# Patient Record
Sex: Female | Born: 1965
Health system: Southern US, Community
[De-identification: ages and names within clinical notes are randomized; demographics above are authoritative.]

## PROBLEM LIST (undated history)

## (undated) DIAGNOSIS — E039 Hypothyroidism, unspecified: Secondary | ICD-10-CM

## (undated) DIAGNOSIS — E079 Disorder of thyroid, unspecified: Secondary | ICD-10-CM

## (undated) DIAGNOSIS — I10 Essential (primary) hypertension: Secondary | ICD-10-CM

## (undated) DIAGNOSIS — G4730 Sleep apnea, unspecified: Secondary | ICD-10-CM

## (undated) DIAGNOSIS — E785 Hyperlipidemia, unspecified: Secondary | ICD-10-CM

## (undated) DIAGNOSIS — M109 Gout, unspecified: Secondary | ICD-10-CM

## (undated) DIAGNOSIS — M1990 Unspecified osteoarthritis, unspecified site: Secondary | ICD-10-CM

## (undated) DIAGNOSIS — J45909 Unspecified asthma, uncomplicated: Secondary | ICD-10-CM

## (undated) DIAGNOSIS — K219 Gastro-esophageal reflux disease without esophagitis: Secondary | ICD-10-CM

## (undated) HISTORY — PX: THYROID SURGERY: SHX805

## (undated) HISTORY — DX: Essential (primary) hypertension: I10

## (undated) HISTORY — DX: Disorder of thyroid, unspecified: E07.9

## (undated) HISTORY — DX: Unspecified asthma, uncomplicated: J45.909

## (undated) HISTORY — DX: Unspecified osteoarthritis, unspecified site: M19.90

## (undated) HISTORY — PX: HAND SURGERY: SHX662

## (undated) HISTORY — DX: Morbid (severe) obesity due to excess calories: E66.01

## (undated) HISTORY — DX: Hyperlipidemia, unspecified: E78.5

---

## 1983-11-18 HISTORY — PX: CHOLECYSTECTOMY: SHX55

## 2008-07-02 ENCOUNTER — Encounter: Admission: RE | Admit: 2008-07-02 | Discharge: 2008-07-02 | Payer: Self-pay | Admitting: Orthopedic Surgery

## 2009-05-16 ENCOUNTER — Ambulatory Visit (HOSPITAL_COMMUNITY): Admission: RE | Admit: 2009-05-16 | Discharge: 2009-05-16 | Payer: Self-pay | Admitting: Orthopedic Surgery

## 2009-06-27 ENCOUNTER — Encounter: Admission: RE | Admit: 2009-06-27 | Discharge: 2009-08-16 | Payer: Self-pay | Admitting: Orthopedic Surgery

## 2009-11-17 HISTORY — PX: OTHER SURGICAL HISTORY: SHX169

## 2010-02-18 ENCOUNTER — Encounter: Admission: RE | Admit: 2010-02-18 | Discharge: 2010-02-18 | Payer: Self-pay | Admitting: Unknown Physician Specialty

## 2010-03-12 ENCOUNTER — Encounter
Admission: RE | Admit: 2010-03-12 | Discharge: 2010-03-12 | Payer: Self-pay | Admitting: Physical Medicine & Rehabilitation

## 2010-05-13 ENCOUNTER — Encounter: Admission: RE | Admit: 2010-05-13 | Discharge: 2010-05-13 | Payer: Self-pay | Admitting: Unknown Physician Specialty

## 2010-05-25 ENCOUNTER — Emergency Department (HOSPITAL_COMMUNITY): Admission: EM | Admit: 2010-05-25 | Discharge: 2010-05-25 | Payer: Self-pay | Admitting: Emergency Medicine

## 2010-07-04 ENCOUNTER — Encounter: Admission: RE | Admit: 2010-07-04 | Discharge: 2010-07-04 | Payer: Self-pay | Admitting: Unknown Physician Specialty

## 2010-10-01 ENCOUNTER — Other Ambulatory Visit: Admission: RE | Admit: 2010-10-01 | Discharge: 2010-10-01 | Payer: Self-pay | Admitting: Registered Nurse

## 2010-11-17 HISTORY — PX: HAND SURGERY: SHX662

## 2010-12-08 ENCOUNTER — Encounter: Payer: Self-pay | Admitting: Unknown Physician Specialty

## 2011-02-24 LAB — CBC
MCHC: 33.5 g/dL (ref 30.0–36.0)
RBC: 4.61 MIL/uL (ref 3.87–5.11)
WBC: 6.5 10*3/uL (ref 4.0–10.5)

## 2011-02-24 LAB — COMPREHENSIVE METABOLIC PANEL
ALT: 12 U/L (ref 0–35)
Albumin: 3.7 g/dL (ref 3.5–5.2)
Alkaline Phosphatase: 99 U/L (ref 39–117)
Calcium: 10.2 mg/dL (ref 8.4–10.5)
Chloride: 107 mEq/L (ref 96–112)
GFR calc Af Amer: >60 mL/min (ref >60)
Total Bilirubin: 0.6 mg/dL (ref 0.3–1.2)
Total Protein: 7.2 g/dL (ref 6.0–8.3)

## 2011-02-24 LAB — DIFFERENTIAL
Lymphocytes Relative: 28 % (ref 12–46)
Lymphs Abs: 1.8 10*3/uL (ref 0.7–4.0)
Neutro Abs: 3.7 10*3/uL (ref 1.7–7.7)
Neutrophils Relative %: 56 % (ref 43–77)

## 2011-03-25 ENCOUNTER — Observation Stay (HOSPITAL_COMMUNITY)
Admission: EM | Admit: 2011-03-25 | Discharge: 2011-03-26 | Disposition: A | Discharge status: Home / Self Care | Payer: PRIVATE HEALTH INSURANCE | Source: Ambulatory Visit | Attending: Hospitalist | Admitting: Hospitalist

## 2011-03-25 ENCOUNTER — Emergency Department (HOSPITAL_COMMUNITY): Payer: PRIVATE HEALTH INSURANCE

## 2011-03-25 DIAGNOSIS — I2510 Atherosclerotic heart disease of native coronary artery without angina pectoris: Secondary | ICD-10-CM | POA: Insufficient documentation

## 2011-03-25 DIAGNOSIS — R0989 Other specified symptoms and signs involving the circulatory and respiratory systems: Secondary | ICD-10-CM | POA: Insufficient documentation

## 2011-03-25 DIAGNOSIS — E785 Hyperlipidemia, unspecified: Secondary | ICD-10-CM | POA: Insufficient documentation

## 2011-03-25 DIAGNOSIS — Z79899 Other long term (current) drug therapy: Secondary | ICD-10-CM | POA: Insufficient documentation

## 2011-03-25 DIAGNOSIS — R0609 Other forms of dyspnea: Secondary | ICD-10-CM | POA: Insufficient documentation

## 2011-03-25 DIAGNOSIS — K219 Gastro-esophageal reflux disease without esophagitis: Secondary | ICD-10-CM | POA: Insufficient documentation

## 2011-03-25 DIAGNOSIS — E039 Hypothyroidism, unspecified: Secondary | ICD-10-CM | POA: Insufficient documentation

## 2011-03-25 DIAGNOSIS — R0789 Other chest pain: Principal | ICD-10-CM | POA: Insufficient documentation

## 2011-03-25 LAB — DIFFERENTIAL
Basophils Relative: 0 % (ref 0–1)
Eosinophils Absolute: 0.2 10*3/uL (ref 0.0–0.7)
Lymphocytes Relative: 32 % (ref 12–46)
Monocytes Absolute: 1.3 10*3/uL — ABNORMAL HIGH (ref 0.1–1.0)
Monocytes Relative: 13 % — ABNORMAL HIGH (ref 3–12)
Neutrophils Relative %: 53 % (ref 43–77)

## 2011-03-25 LAB — COMPREHENSIVE METABOLIC PANEL
Alkaline Phosphatase: 109 U/L (ref 39–117)
CO2: 25 mEq/L (ref 19–32)
Chloride: 104 mEq/L (ref 96–112)
Sodium: 138 mEq/L (ref 135–145)
Total Bilirubin: 0.4 mg/dL (ref 0.3–1.2)

## 2011-03-25 LAB — POCT CARDIAC MARKERS
CKMB, poc: <1 ng/mL — ABNORMAL LOW (ref 1.0–8.0)
CKMB, poc: <1 ng/mL — ABNORMAL LOW (ref 1.0–8.0)
Myoglobin, poc: 43.8 ng/mL (ref 12–200)
Troponin i, poc: <0.05 ng/mL (ref 0.00–0.09)
Troponin i, poc: <0.05 ng/mL (ref 0.00–0.09)

## 2011-03-25 LAB — D-DIMER, QUANTITATIVE: D-Dimer, Quant: 0.29 ug/mL-FEU (ref 0.00–0.48)

## 2011-03-25 LAB — CBC: HCT: 40.7 % (ref 36.0–46.0)

## 2011-03-26 LAB — CBC
MCHC: 33.9 g/dL (ref 30.0–36.0)
MCV: 92.7 fL (ref 78.0–100.0)
Platelets: 347 10*3/uL (ref 150–400)
RBC: 4.39 MIL/uL (ref 3.87–5.11)

## 2011-03-26 LAB — LIPID PANEL
Cholesterol: 158 mg/dL (ref 0–200)
LDL Cholesterol: 84 mg/dL (ref 0–99)
Total CHOL/HDL Ratio: 4.9 RATIO
VLDL: 42 mg/dL — ABNORMAL HIGH (ref 0–40)

## 2011-03-26 LAB — CARDIAC PANEL(CRET KIN+CKTOT+MB+TROPI)
CK, MB: 1.1 ng/mL (ref 0.3–4.0)
Relative Index: 1.1 (ref 0.0–2.5)
Troponin I: <0.3 ng/mL (ref <0.30)

## 2011-03-26 LAB — COMPREHENSIVE METABOLIC PANEL
Albumin: 3.4 g/dL — ABNORMAL LOW (ref 3.5–5.2)
Alkaline Phosphatase: 111 U/L (ref 39–117)
Chloride: 102 mEq/L (ref 96–112)
GFR calc Af Amer: >60 mL/min (ref >60)
GFR calc non Af Amer: >60 mL/min (ref >60)
Potassium: 3.8 mEq/L (ref 3.5–5.1)
Total Bilirubin: 0.6 mg/dL (ref 0.3–1.2)

## 2011-03-26 LAB — TSH: TSH: 0.586 u[IU]/mL (ref 0.350–4.500)

## 2011-03-30 NOTE — Discharge Summary (Signed)
  [name], [name]                ACCOUNT NO.:  [id number]  MEDICAL RECORD NO.:  [id number]           PATIENT TYPE:  O  LOCATION:  3728                         FACILITY:  [facility]  PHYSICIAN:  Jeoffrey Massed, MD    DATE OF BIRTH:  08-21-1966  DATE OF ADMISSION:  03/25/2011 DATE OF DISCHARGE:                        DISCHARGE SUMMARY - REFERRING   PRIMARY CARE PRACTITIONER:  Georgianne Fick, MD.  PRIMARY CARDIOLOGIST:  Jay R. Jacinto Halim, MD  PRIMARY DISCHARGE DIAGNOSIS:  Atypical chest pain likely gastrointestinal in nature.  SECONDARY DISCHARGE DIAGNOSES: 1. Hypertension. 2. Dyslipidemia. 3. Hypothyroidism. 4. Prior history of gastroesophageal reflux disease. 5. Hypothyroidism. 6. Morbid obesity.  CONSULTATION:  Dr. Jacinto Halim, from Cardiology.  BRIEF HISTORY OF PRESENT ILLNESS:  The patient is a 45 year old obese African-American female with the above-noted medical issues, presented to the ED with chest pain.  She described the chest pain as heaviness, but also claimed that she felt as if the food was getting stuck in throat.  She had like 2 to 3 episodes of this on the day of admission and has had no further episodes here in the hospital.  For further details, please see the history and physical that was dictated by Dr. Toniann Fail on admission.  PERTINENT LABORATORY DATA: 1. Cardiac enzymes were cycled and these were negative. 2. LDL cholesterol was 84. 3. TSH was 0.586. 4. D-dimer was 0.29.  PERTINENT RADIOLOGICAL STUDIES:  Chest x-ray showed cardiomegaly and bibasilar atelectasis.  BRIEF HOSPITAL COURSE: 1. Chest pain.  This was felt to be more GI in nature.  Given her risk     factors and as the patient sees Dr. Jacinto Halim as an outpatient, the     patient was seen in consult with Dr. Jacinto Halim while the patient was in     the hospital.  He suggested no further workup be done in the     patient and the patient see a gastroenterologist as an outpatient.     Rest of her medical  issues were stable. 2. Disposition:  It is felt that this patient is stable to be     discharged home.  FOLLOWUP INSTRUCTIONS: 1. The patient to follow up with the primary care practitioner within     1 week upon discharge. 2. The patient will need referral to see a gastroenterologist from her     primary care practitioner.  We will leave this to the discretion of     her primary care practitioner. 3. The patient to follow up with Dr. Jacinto Halim within 1 or 2 weeks upon     discharge.  Total time spent coordinating discharge equals 45 minutes.     Jeoffrey Massed, MD     SG/MEDQ  D:  03/26/2011  T:  03/26/2011  Job:  [id number]  cc:   Cristy Hilts. Jacinto Halim, MD Georgianne Fick, M.D.  Electronically Signed by Jeoffrey Massed  on 03/30/2011 03:00:24 PM

## 2011-03-30 NOTE — H&P (Signed)
[name], [name]                ACCOUNT NO.:  [id number]  MEDICAL RECORD NO.:  [id number]           PATIENT TYPE:  O  LOCATION:  3728                         FACILITY:  [facility]  PHYSICIAN:  Eduard Clos, MDDATE OF BIRTH:  February 19, 1966  DATE OF ADMISSION:  03/25/2011 DATE OF DISCHARGE:                             HISTORY & PHYSICAL   PRIMARY CARE PHYSICIAN:  Georgianne Fick, MD  CHIEF COMPLAINT:  Chest pain.  HISTORY OF PRESENTING ILLNESS:  A 45 year old female with known history of hypertension, hyperlipidemia, obesity, hypothyroidism, has presented to the ER with a complaint of chest pain.  She states the chest pain started last afternoon around 11 o'clock while she was watching TV.  The pain was retrosternal, associated with no shortness of breath, no nausea, vomiting or diaphoresis, or palpitations, or any abdominal pain. In the ER, the patient had cardiac enzymes and EKG which at this time does not show anything acute.  The patient is admitted for further workup.  The patient states the chest pain is retrosternal, pressure-like, lasts for less than 2 minutes each time, has come multiple times.  The patient denies any headache or visual symptoms.  Denies any focal deficit. Denies any dizziness, loss of consciousness.  Denies any nausea, vomiting, abdominal pain, dysuria, discharge, diarrhea.  PAST MEDICAL HISTORY: 1. History of hypertension. 2. Hyperlipidemia. 3. Hypothyroidism. 4. History of GERD.  Has had a cardiac cath in [place] 4-5 years ago and the patient states that one of the arteries was not able to be stented, they found it difficult.  PAST SURGICAL HISTORY:  Gallbladder surgery, has had a tubal pregnancy, __________ surgery.  MEDICATIONS PRIOR TO ADMISSION:  Atenolol, Crestor, levothyroxine, nitroglycerin, Norvasc, and Prilosec.  Medication doses have to be verified.  ALLERGIES:  IBUPROFEN and LYRICA.  FAMILY HISTORY:  Significant for  coronary artery disease.  SOCIAL HISTORY:  The patient does not smoke cigarettes, drink alcohol, or use illegal drugs.  REVIEW OF SYSTEMS:  As per history of present illness, nothing else significant.  PHYSICAL EXAMINATION:  GENERAL:  The patient examined bedside, not in acute distress. VITAL SIGNS:  Blood pressure 120/70, pulse 50 per minute, temperature 98.0, respirations 18, O2 sat 96%. HEENT: Anicteric.  No pallor.  No discharge from ears, eyes, nose, or mouth. CHEST:  Bilateral air entry present.  No rhonchi.  No crepitation. HEART: S1, S2 heard. ABDOMEN:  Soft, nontender.  Bowel sounds heard. CNS:  The patient is alert, awake, oriented to time, place, and person. Moves upper and lower extremities 5/5. EXTREMITIES:  Peripheral pulses felt.  No edema.  LABORATORY DATA:  EKG shows normal sinus rhythm, heart rate is around 54 beats per minute with nonspecific ST-T changes.  There is some T-wave inversion in the V3 and inferior leads.  The lateral leads showing low voltage at this time.  Chest x-ray shows cardiomegaly and basilar atelectasis.  CBC:  WBC is 10.2, hemoglobin is 13. 5, hematocrit 40.7, platelets 356.  D-dimers 0.29.  Complete metabolic panel:  Sodium 138, potassium 3.7, chloride 104, carbon dioxide 95, glucose 98, BUN 9, creatinine 0.7, total bilirubin is  0.4, alkaline phosphatase 109, AST 13, ALT 10, total protein 7.5, albumin 3.6, calcium 9.9, CK-MB is less than 1, troponin less than 0.05, myoglobin is 39.7.  ASSESSMENT: 1. Chest pain, rule out acute coronary syndrome. 2. History of hypertension. 3. History of hyperlipidemia. 4. History of hyperthyroidism. 5. History of obesity.  PLAN: 1. At this time, we will admit the patient to telemetry. 2. Chest pain.  At this time the patient is chest-pain free.  We will     cycle cardiac markers.  The patient was given aspirin.  We will get     a 2-D echo, nitroglycerin. 3. For her hypertension and hypothyroidism,  we will continue present     medication.  We will check her TSH and a fasting lipid panel and     further recommendation based on tests ordered and clinical course.     Eduard Clos, MD     ANK/MEDQ  D:  03/26/2011  T:  03/26/2011  Job:  [id number]  cc:   Georgianne Fick, M.D.  Electronically Signed by Midge Minium MD on 03/30/2011 08:39:31 AM

## 2011-04-01 NOTE — Op Note (Signed)
NAMEDEEANNA, BEIGHTOL                ACCOUNT NO.:  [id number]   MEDICAL RECORD NO.:  [id number]          PATIENT TYPE:  AMB   LOCATION:  SDS                          FACILITY:  [facility]   PHYSICIAN:  Nadara Mustard, MD     DATE OF BIRTH:  13-Jul-1966   DATE OF PROCEDURE:  05/16/2009  DATE OF DISCHARGE:  05/16/2009                               OPERATIVE REPORT   PREOPERATIVE DIAGNOSIS:  Rotator cuff tear, right shoulder with  impingement syndrome.   POSTOPERATIVE DIAGNOSIS:  Rotator cuff tear, right shoulder with  impingement syndrome.   PROCEDURE:  1. Right shoulder arthroscopy, debridement of rotator cuff tear,      debridement of superior labrum anterior and posterior lesion, and      subacromial decompression.  2. Extra time was used for surgery secondary to body mass index of 55.   SURGEON:  Nadara Mustard, MD   ANESTHESIA:  General plus interscalene block.   ESTIMATED BLOOD LOSS:  Minimal.   ANTIBIOTICS:  None.   DRAINS:  None.   COMPLICATIONS:  None.   DISPOSITION:  To PACU in stable condition.   INDICATIONS FOR PROCEDURE:  The patient is a 45 year old woman with  impingement symptom and rotator cuff tear of the right shoulder.  She  has failed conservative care, has pain with activities of daily living,  and presents at this time for arthroscopic intervention.  Risks and  benefits were discussed including infection, neurovascular injury,  persistent pain, and need for additional surgery.  The patient states  she understands and wished proceed at this time.   DESCRIPTION OF PROCEDURE:  The patient was brought to OR 15 after  undergoing an interscalene block.  She then underwent general  anesthetic.  After adequate level of anesthesia was obtained, the  patient was placed in a beach-chair position.  The right upper extremity  was prepped using DuraPrep and draped into a sterile field.  The scope  was inserted from the posterior portal and the anterior portal was  established from an outside-in technique with an 18-gauge spinal.  Visualization showed grade 1 SLAP lesion and partial tearing of the  biceps with a rotator cuff tear.  The patient underwent debridement of  the SLAP lesion, debridement of biceps tendon tear as well as  debridement of rotator cuff tear.  The shaver and the VAPR wand were  used.  The instruments were then removed.  The scope was then inserted  from the posterior portal in the subacromial space and a new lateral  portal was established.  The patient had bursitis and the bursa was  excised.  She had a hook-type 3 acromion.  The patient underwent a  subacromial decompression.  The VAPR wand was used for hemostasis.  The  instruments were  removed.  The portals were closed using 2-0 nylon.  The wounds were  covered with Adaptic, orthopedic sponges, ABD dressing, and Hypafix  tape.  The patient was placed in a sling, extubated, taken to PACU in  stable condition.  Plan for discharge to home.  Prescription for Tylox  for  pain.  Plan to follow up in the office in 2 weeks.      Nadara Mustard, MD  Electronically Signed     MVD/MEDQ  D:  05/16/2009  T:  05/16/2009  Job:  [id number]

## 2011-04-08 ENCOUNTER — Other Ambulatory Visit: Payer: Self-pay | Admitting: *Deleted

## 2011-04-08 ENCOUNTER — Ambulatory Visit
Admission: RE | Admit: 2011-04-08 | Discharge: 2011-04-08 | Disposition: A | Discharge status: Home / Self Care | Payer: PRIVATE HEALTH INSURANCE | Source: Ambulatory Visit | Attending: *Deleted | Admitting: *Deleted

## 2011-04-08 DIAGNOSIS — M545 Low back pain, unspecified: Secondary | ICD-10-CM

## 2011-04-08 NOTE — Consult Note (Signed)
NAMELUNDEN, STIEBER                ACCOUNT NO.:  [id number]  MEDICAL RECORD NO.:  [id number]           PATIENT TYPE:  O  LOCATION:  3728                         FACILITY:  [facility]  PHYSICIAN:  Cristy Hilts. Jacinto Halim, MD       DATE OF BIRTH:  [birth year]  DATE OF CONSULTATION:  03/26/2011 DATE OF DISCHARGE:  03/26/2011                                CONSULTATION   REASON FOR CONSULTATION:  Chest pain, please evaluate angina pectoris.  HISTORY:  [name] is a patient of mine who has had known coronary artery disease by cardiac catheterization in 2006 when she presented with chest pain and was felt to be not amenable for percutaneous revascularization and was treated medically.  She had been doing well until yesterday.  She developed chest heaviness.  She described this as pressure-like sensation in the middle of her chest lasting a few seconds.  It kept on and off all afternoon, hence she presented to the emergency room.  She states that she feels like the food is struck in the middle of her chest.  She does not feel like this is angina pectoris.  She has taken sublingual nitroglycerin without any relief.  REVIEW OF SYSTEMS:  She has arthritis of left knee worse than the right. She is not a diabetic.  She has hypothyroid and takes replacements.  No prior history of stroke or GI bleed.  No recent weight change.  Other systems are negative.  MEDICATIONS:  Her home medications include; 1. Omeprazole 40 mg p.o. daily. 2. Aspirin 325 mg p.o. daily. 3. Voltaren gel p.r.n. 4. Amlodipine 5 mg p.o. daily. 5. Synthroid 175 mcg p.o. daily. 6. Crestor 40 mg p.o. daily. 7. Atenolol 50 mg p.o. daily.  ALLERGIES:  No known drug allergies.  PAST MEDICAL HISTORY:  Significant for cholecystectomy in 1989, partial thyroidectomy in 2008, cardiac catheterization in 2006.  ALLERGIES:  IBUPROFEN, she has difficulty in urination and LYRICA causes her to have cramping and nausea.  Her medical  problems include hypertension, hyperlipidemia, hypothyroidism, GERD, and chronic chest pain.  SOCIAL HISTORY:  She does not use any tobacco products.  She does not drink alcohol.  Her 3 children are married.  She is presently disabled from work.  PHYSICAL EXAMINATION:  GENERAL:  She is moderately built, morbidly obese.  She appears to be in stated age.  She is not in any acute distress. VITAL SIGNS:  Temperature of 97.3, pulse is 76 beats per minute, respirations 14, blood pressure 98/68 mmHg. CARDIAC:  S1 and S2 is normal.  There is distant heart sounds.  No gallop or murmur. CHEST:  Clear. ABDOMEN:  Obese abdomen with pannus. EXTREMITIES:  Warm, nontender without any edema.  Peripheral arterial examination was normal.  Her EKG demonstrated sinus rhythm, poor R-wave progression, nonspecific T-wave changes.  Her cardiac markers have been negative for myocardial injury x2.  Her CBC and BMP within normal limits.  IMPRESSION: 1. Chest pain appears to be more of gastroesophageal reflux disease     and esophageal spasm, then to angina pectoris. 2. History of known coronary artery disease by  cardiac catheterization     in 2006, felt not amenable for angioplasty, was treated medically. 3. Morbid obesity. 4. Hypertension. 5. Hyperlipidemia.  RECOMMENDATIONS:  At this point I do not feel the chest pain is from cardiac etiology.  Hence, the patient can be discharged home safely. She has had a Cardiolite stress test in our office recently on November 15, 2010, which revealed apical scar but no evidence of ischemia, low risk.  The defect probably represented gut uptake artifact and breast attenuation artifact in a patient who is morbidly obese with a BMI of 57.  I will see her back in the office in about 2 weeks.  I recommend that she probably try Dexilant instead of omeprazole for her GERD.  She may need further GI workup in the outpatient basis.  Thank you for asking me to see the  patient.     Cristy Hilts. Jacinto Halim, MD     JRG/MEDQ  D:  03/26/2011  T:  03/26/2011  Job:  [id number]  cc:   Georgianne Fick, M.D.  Electronically Signed by Yates Decamp MD on 04/08/2011 07:50:45 PM

## 2011-04-17 ENCOUNTER — Other Ambulatory Visit: Payer: Self-pay | Admitting: Pain Medicine

## 2011-04-17 DIAGNOSIS — M545 Low back pain: Secondary | ICD-10-CM

## 2011-06-23 ENCOUNTER — Other Ambulatory Visit (HOSPITAL_COMMUNITY): Payer: PRIVATE HEALTH INSURANCE

## 2011-06-23 ENCOUNTER — Encounter (HOSPITAL_COMMUNITY)
Admission: RE | Admit: 2011-06-23 | Discharge: 2011-06-23 | Disposition: A | Discharge status: Home / Self Care | Payer: PRIVATE HEALTH INSURANCE | Source: Ambulatory Visit | Attending: Oral Surgery | Admitting: Oral Surgery

## 2011-06-23 LAB — CBC
HCT: 39.9 % (ref 36.0–46.0)
Hemoglobin: 13.7 g/dL (ref 12.0–15.0)
MCHC: 34.3 g/dL (ref 30.0–36.0)
RBC: 4.3 MIL/uL (ref 3.87–5.11)
WBC: 9 10*3/uL (ref 4.0–10.5)

## 2011-06-23 LAB — BASIC METABOLIC PANEL
CO2: 28 mEq/L (ref 19–32)
Calcium: 9.8 mg/dL (ref 8.4–10.5)
Potassium: 4.7 mEq/L (ref 3.5–5.1)
Sodium: 137 mEq/L (ref 135–145)

## 2011-06-23 LAB — HCG, SERUM, QUALITATIVE: Preg, Serum: NEGATIVE

## 2011-07-01 ENCOUNTER — Ambulatory Visit (HOSPITAL_COMMUNITY)
Admission: RE | Admit: 2011-07-01 | Discharge: 2011-07-01 | Disposition: A | Discharge status: Home / Self Care | Payer: PRIVATE HEALTH INSURANCE | Source: Ambulatory Visit | Attending: Oral Surgery | Admitting: Oral Surgery

## 2011-07-01 DIAGNOSIS — I10 Essential (primary) hypertension: Secondary | ICD-10-CM | POA: Insufficient documentation

## 2011-07-01 DIAGNOSIS — K219 Gastro-esophageal reflux disease without esophagitis: Secondary | ICD-10-CM | POA: Insufficient documentation

## 2011-07-01 DIAGNOSIS — I209 Angina pectoris, unspecified: Secondary | ICD-10-CM | POA: Insufficient documentation

## 2011-07-01 DIAGNOSIS — Z01812 Encounter for preprocedural laboratory examination: Secondary | ICD-10-CM | POA: Insufficient documentation

## 2011-07-03 NOTE — Op Note (Signed)
NAMEGIANNINA, [name]                ACCOUNT NO.:  [id number]  MEDICAL RECORD NO.:  [id number]  LOCATION:  SDSC                         FACILITY:  [facility]  PHYSICIAN:  [name], M.D.  DATE OF BIRTH:  02-10-1966  DATE OF PROCEDURE:  07/01/2011 DATE OF DISCHARGE:  07/01/2011                              OPERATIVE REPORT   PREOPERATIVE DIAGNOSIS:  Non-restorable impacted teeth #1, #16, #17, #32.  POSTOPERATIVE DIAGNOSIS:  Non-restorable impacted teeth #1, #16, #17, #32.  PROCEDURE:  Removal of teeth #1, #16, #17, #32.  SURGEON:  [name], MD.  ANESTHESIA:  General oral.  ASSISTANTS: 1. Luberta Mutter, DOMA 2. Jamie Cimler, DOMA.  INDICATIONS FOR PROCEDURE:  [name] is a 45 year old female, who is referred to my office by her general dentist for removal of all four wisdom teeth secondary to dental pain.  The teeth were found to be imbedded in the bone.  [name] has comorbidities of hypertension, angina, thyroid condition, and arthritis, and she is morbidly obese.  Because of the need for general anesthesia and the need for airway protection, the case was scheduled for general anesthesia with intubation.  DESCRIPTION OF PROCEDURE:  The patient was taken to the operating room, placed on table in supine position.  General anesthesia was administered intravenously and an oral endotracheal tube was placed and secured.  The eyes were protected and the patient was draped for the procedure.  The posterior pharynx was suctioned and a throat pack was placed.  2% lidocaine 1:100,000 epinephrine was infiltrated in an inferior alveolar block on the right and left side and buccal and palatal infiltration of the maxilla.  A bite block was placed in the right side of the mouth and sweetheart retractor was used to retract the tongue.  A #15 blade was used to make an incision around teeth numbers 16 and 32.  The periosteum was reflected around these teeth.  Circumferential bone was  removed with the striker handpiece fissure bur under irrigation.  The teeth were then elevated with a 301 elevator and the lower tooth was removed with the #151 forceps and the upper tooth was removed with a #150 forceps.  The sockets were curetted and then irrigated and then closed with 3-0 chromic.  The bite block and sweetheart were repositioned and a #15 blade was used to make a full-thickness incision around teeth numbers #1 and #32.  The periosteum was reflected with periosteal elevator.  Bone was removed with a striker handpiece under irrigation.  The teeth were elevated with 301 elevator and removed from the mouth with a dental forceps.  The sockets were curetted and irrigated and then closed with 3- 0 chromic.  The oral cavity was inspected, found to have good contour, hemostasis, and closure.  The oral cavity was irrigated and suction and then the throat pack was removed.  The patient was awakened, taken to the recovery room, breathing spontaneously in good condition.  ESTIMATED BLOOD LOSS:  Minimum.  COMPLICATIONS:  None.  SPECIMENS:  None.  The patient was scheduled for discharge same day.  Given prescription for Percocet 5/325, #30, one to two every 4-6 hours p.r.n. pain.  She was given  routine postoperative instructions and scheduled for postoperative visit in 2 weeks in my office.     [name], M.D.     SMJ/MEDQ  D:  07/01/2011  T:  07/01/2011  Job:  [id number]  Electronically Signed by Ocie Doyne M.D. on 07/03/2011 12:48:34 PM

## 2011-07-10 ENCOUNTER — Emergency Department (HOSPITAL_COMMUNITY)
Admission: EM | Admit: 2011-07-10 | Discharge: 2011-07-10 | Disposition: A | Discharge status: Home / Self Care | Payer: PRIVATE HEALTH INSURANCE | Attending: Emergency Medicine | Admitting: Emergency Medicine

## 2011-07-10 DIAGNOSIS — I10 Essential (primary) hypertension: Secondary | ICD-10-CM | POA: Insufficient documentation

## 2011-07-10 DIAGNOSIS — K219 Gastro-esophageal reflux disease without esophagitis: Secondary | ICD-10-CM | POA: Insufficient documentation

## 2011-07-10 DIAGNOSIS — Z79899 Other long term (current) drug therapy: Secondary | ICD-10-CM | POA: Insufficient documentation

## 2011-07-10 DIAGNOSIS — N898 Other specified noninflammatory disorders of vagina: Secondary | ICD-10-CM | POA: Insufficient documentation

## 2011-07-10 DIAGNOSIS — R300 Dysuria: Secondary | ICD-10-CM | POA: Insufficient documentation

## 2011-07-10 DIAGNOSIS — I2510 Atherosclerotic heart disease of native coronary artery without angina pectoris: Secondary | ICD-10-CM | POA: Insufficient documentation

## 2011-07-10 DIAGNOSIS — N390 Urinary tract infection, site not specified: Secondary | ICD-10-CM | POA: Insufficient documentation

## 2011-07-10 DIAGNOSIS — E039 Hypothyroidism, unspecified: Secondary | ICD-10-CM | POA: Insufficient documentation

## 2011-07-10 DIAGNOSIS — E785 Hyperlipidemia, unspecified: Secondary | ICD-10-CM | POA: Insufficient documentation

## 2011-07-10 LAB — URINALYSIS, ROUTINE W REFLEX MICROSCOPIC
Ketones, ur: 15 mg/dL — AB
Nitrite: POSITIVE — AB
Protein, ur: 100 mg/dL — AB
pH: 5 (ref 5.0–8.0)

## 2011-07-10 LAB — URINE MICROSCOPIC-ADD ON

## 2011-07-10 LAB — WET PREP, GENITAL
Clue Cells Wet Prep HPF POC: NONE SEEN
WBC, Wet Prep HPF POC: NONE SEEN

## 2011-07-10 LAB — POCT PREGNANCY, URINE: Preg Test, Ur: NEGATIVE

## 2011-07-11 LAB — GC/CHLAMYDIA PROBE AMP, GENITAL: Chlamydia, DNA Probe: NEGATIVE

## 2012-06-02 ENCOUNTER — Other Ambulatory Visit: Payer: Self-pay | Admitting: Internal Medicine

## 2012-06-02 DIAGNOSIS — R609 Edema, unspecified: Secondary | ICD-10-CM

## 2013-02-21 ENCOUNTER — Telehealth (INDEPENDENT_AMBULATORY_CARE_PROVIDER_SITE_OTHER): Payer: Self-pay

## 2013-02-21 NOTE — Telephone Encounter (Signed)
Patient appointment time changed to 4:00 on 02/24/13.  Patient aware to arrive 15-20 minutes early for registration.

## 2013-02-24 ENCOUNTER — Ambulatory Visit (INDEPENDENT_AMBULATORY_CARE_PROVIDER_SITE_OTHER): Payer: 59 | Admitting: General Surgery

## 2013-03-11 ENCOUNTER — Telehealth: Payer: Self-pay | Admitting: Neurology

## 2013-03-11 DIAGNOSIS — I10 Essential (primary) hypertension: Secondary | ICD-10-CM

## 2013-03-11 DIAGNOSIS — R0683 Snoring: Secondary | ICD-10-CM

## 2013-03-11 NOTE — Telephone Encounter (Signed)
Dr. Georgianne Fick is referring patient for evaluation of sleep apnea.  Wt. 316 Ht. 63.5 BMI 55.09  Morbid Obesity Snoring  Chronic Fatigue HTN Hyperlipidemia Hypothyroidism  Medications Atenolol 25 MG Omeprazole 20 MG Allopurinol 100 MG Colcrys 0.6 MG Oxycodone HCL 30 MG Aspirin 81 MG Nitroglycerin 0.4 MG/SPRAY Amlodipine Besylate 10 MG Dexilant 30 MG Crestor 40 MG Levothyroxine Sodium 175 MCG  Dr. Nicholos Johns requesting patient be evaluated for sleep apnea.  She endorses Epworth at 16.  Quality of sleep questionaire score is 13.

## 2013-03-15 NOTE — Telephone Encounter (Signed)
This patient qualifies for attended sleep study based on clinical history.  Patient will need a  SPLIT at AHI 10 and  Depending on insurance status  either 4 % or 3% scoring.  Offer lunesta prn,  with this BMI  He is superobese.  CO2 is  necessary. CD

## 2013-03-17 ENCOUNTER — Encounter (INDEPENDENT_AMBULATORY_CARE_PROVIDER_SITE_OTHER): Payer: Self-pay | Admitting: General Surgery

## 2013-03-17 ENCOUNTER — Telehealth (INDEPENDENT_AMBULATORY_CARE_PROVIDER_SITE_OTHER): Payer: Self-pay

## 2013-03-17 ENCOUNTER — Ambulatory Visit (INDEPENDENT_AMBULATORY_CARE_PROVIDER_SITE_OTHER): Payer: 59 | Admitting: General Surgery

## 2013-03-17 ENCOUNTER — Encounter (INDEPENDENT_AMBULATORY_CARE_PROVIDER_SITE_OTHER): Payer: Self-pay

## 2013-03-17 DIAGNOSIS — M129 Arthropathy, unspecified: Secondary | ICD-10-CM

## 2013-03-17 DIAGNOSIS — I25810 Atherosclerosis of coronary artery bypass graft(s) without angina pectoris: Secondary | ICD-10-CM

## 2013-03-17 DIAGNOSIS — E039 Hypothyroidism, unspecified: Secondary | ICD-10-CM

## 2013-03-17 DIAGNOSIS — M1990 Unspecified osteoarthritis, unspecified site: Secondary | ICD-10-CM

## 2013-03-17 DIAGNOSIS — K219 Gastro-esophageal reflux disease without esophagitis: Secondary | ICD-10-CM

## 2013-03-17 DIAGNOSIS — I10 Essential (primary) hypertension: Secondary | ICD-10-CM

## 2013-03-17 NOTE — Progress Notes (Signed)
Patient ID: [name], female   DOB: [birth year], 47 y.o.   MRN: [id number]  Chief Complaint  Patient presents with  . Bariatric Pre-op    HPI [name] is a 47 y.o. female. This patient presents for her initial weight loss surgery evaluation. She has a BMI of 56 with obesity related comorbidities of hypertension, hyperlipidemia, hypothyroid, GERD, coronary artery disease, and arthritis in her back and her knees.  She says she struggled with her weight ever since she was located and has tried several diets including the [employer], grains, low carb diet and just eating right which was the most successful for her. She is most interested in the gastric bypass because she is not interested in the maintenance of the band. She has a hard time exercising currently due to her knees and her back and was not currently exercising. She does have heartburn almost daily and she takes over-the-counter PPIs for this and she says that she will have even some regurgitation into her throat when lying flat and not sleeping up on pillows. She has a tender information session and she has actually been set up for a sleep study by her primary care physician but the results are not available today. She says that weight loss surgery has been recommended by her cardiologist several times before. Her cardiologist is Dr. Jacinto Halim.  She denies any current chest pain and denies any history of heart attack.  HPI  Past Medical History  Diagnosis Date  . Hypertension   . Thyroid disease   . Diabetes mellitus without complication   . CHF (congestive heart failure)   . Arthritis   . Hyperlipidemia     Past Surgical History  Procedure Laterality Date  . Cholecystectomy    . Shoulder Right   . Hand surgery Left 2013  . Hand surgery    . Thyroid surgery    . Knee surgery      History reviewed. No pertinent family history.  Social History History  Substance Use Topics  . Smoking status: Former Smoker    Quit  date: 03/17/2005  . Smokeless tobacco: Not on file  . Alcohol Use: No    Allergies  Allergen Reactions  . Ibuprofen   . Lyrica (Pregabalin)     Current Outpatient Prescriptions  Medication Sig Dispense Refill  . allopurinol (ZYLOPRIM) 100 MG tablet       . amLODipine (NORVASC) 10 MG tablet       . COLCRYS 0.6 MG tablet       . CRESTOR 40 MG tablet       . DEXILANT 30 MG capsule       . levothyroxine (SYNTHROID, LEVOTHROID) 175 MCG tablet       . OPANA ER, CRUSH RESISTANT, 40 MG T12A       . oxycodone (ROXICODONE) 30 MG immediate release tablet       . PENNSAID 2 % SOLN        No current facility-administered medications for this visit.    Review of Systems Review of Systems All other review of systems negative or noncontributory except as stated in the HPI  Blood pressure 127/81, pulse 66, temperature 97.6 F (36.4 C), temperature source Temporal, resp. rate 14, height 5\' 3"  (1.6 m), weight 317 lb (143.79 kg).  Physical Exam Physical Exam Physical Exam  Nursing note and vitals reviewed. Constitutional: She is oriented to person, place, and time. She appears well-developed and well-nourished. No distress.  HENT:  Head: Normocephalic and atraumatic.  Mouth/Throat: No oropharyngeal exudate.  Eyes: Conjunctivae and EOM are normal. Pupils are equal, round, and reactive to light. Right eye exhibits no discharge. Left eye exhibits no discharge. No scleral icterus.  Neck: Normal range of motion. Neck supple. No tracheal deviation present.  Cardiovascular: Normal rate, regular rhythm, normal heart sounds and intact distal pulses.   Pulmonary/Chest: Effort normal and breath sounds normal. No stridor. No respiratory distress. She has no wheezes.  Abdominal: Soft. Bowel sounds are normal. She exhibits no distension and no mass. There is no tenderness. There is no rebound and no guarding.  She has a lower midline incision as well as a right upper quadrant paramedian incisions from her  prior open cholecystectomy Musculoskeletal: Normal range of motion. She exhibits no edema and no tenderness.  Neurological: She is alert and oriented to person, place, and time.  Skin: Skin is warm and dry. No rash noted. She is not diaphoretic. No erythema. No pallor.  Psychiatric: She has a normal mood and affect. Her behavior is normal. Judgment and thought content normal.    Data Reviewed   Assessment    Morbid obesity with a BMI of 56 and obesity related comorbidities of hypertension, hyperlipidemia, hypothyroid, GERD, coronary artery disease, arthritis, and possible obstructive sleep apnea  Discussion regarding of the nonsurgical and surgical options for weight loss. He discussed the options for lap band, sleeve gastrectomy, a Roux-en-Y gastric bypass. We discussed the risks and benefits and pros and cons of each and she is interested in Roux-en-Y gastric bypass.  The risks of infection, bleeding, pain, scarring, weight regain, too little or too much weight loss, vitamin deficiencies and need for lifelong vitamin supplementation, hair loss, need for protein supplementation, leaks, stricture, reflux, food intolerance, need for reoperation , need for open surgery, injury to spleen or surrounding structures, DVT's, PE, and death again discussed with the patient and the patient expressed understanding and desires to proceed with laparoscopic RYGB, possible open, intraoperative endoscopy.At  I do not think that sleeve gastrectomy be the best option for her given her significant reflux. I did discuss with her the possible need for conversion to open given her prior surgeries. We will go ahead and obtain cardiac clearance from her cardiologist as well as results of the sleep study of and set her up for mammograms as well     Plan    Cardiac clearance, mammograms, sleep study, nutrition and cytology evaluations and preoperative labs        Korben Carcione DAVID 03/17/2013, 11:52 AM

## 2013-03-17 NOTE — Telephone Encounter (Signed)
Pre-Op Cardiac Clearance faxed to Dr. Jacinto Halim @ [phone], fax confirmation rec'd.

## 2013-03-22 ENCOUNTER — Encounter: Payer: Self-pay | Admitting: Cardiology

## 2013-03-29 ENCOUNTER — Ambulatory Visit (INDEPENDENT_AMBULATORY_CARE_PROVIDER_SITE_OTHER): Payer: Medicare Other | Admitting: Neurology

## 2013-03-29 VITALS — BP 128/80 | HR 80 | Ht 63.0 in | Wt 317.0 lb

## 2013-03-29 DIAGNOSIS — G4733 Obstructive sleep apnea (adult) (pediatric): Secondary | ICD-10-CM

## 2013-03-29 DIAGNOSIS — I10 Essential (primary) hypertension: Secondary | ICD-10-CM

## 2013-03-29 DIAGNOSIS — R0683 Snoring: Secondary | ICD-10-CM

## 2013-04-04 ENCOUNTER — Other Ambulatory Visit: Payer: Self-pay | Admitting: *Deleted

## 2013-04-04 DIAGNOSIS — I10 Essential (primary) hypertension: Secondary | ICD-10-CM

## 2013-04-04 DIAGNOSIS — G4733 Obstructive sleep apnea (adult) (pediatric): Secondary | ICD-10-CM

## 2013-04-08 ENCOUNTER — Telehealth: Payer: Self-pay | Admitting: *Deleted

## 2013-04-08 DIAGNOSIS — G4733 Obstructive sleep apnea (adult) (pediatric): Secondary | ICD-10-CM | POA: Insufficient documentation

## 2013-04-08 NOTE — Telephone Encounter (Signed)
Called patient to discuss sleep study results from 03/29/2013.  Discussed findings, recommendations and follow up care.  Patient understood well and all questions were answered.  Sleep Consult was scheduled with Dr. Vickey Huger on 06/02/2013 at 1:30, pt knows to bring her machine and to arrive early.  She understands she can call or be seen sooner if needed and to call us with any questions/concerns regarding her CPAP therapy.   PT REQUESTED A COPY OF STUDY FOR HER RECORDS, THIS WAS MAILED TO HER HOME 04/08/2013.   DME ORDERS ROUTED TO [facility] FOR THIS PT 04/08/2013 - FIRST FOLLOW UP SHOULD OCCUR AFTER 30 DAYS OF CPAP USE PER DR. DOHMEIER'S INSTRUCTIONS.

## 2013-04-13 ENCOUNTER — Ambulatory Visit (HOSPITAL_COMMUNITY)
Admission: RE | Admit: 2013-04-13 | Discharge: 2013-04-13 | Disposition: A | Discharge status: Home / Self Care | Payer: PRIVATE HEALTH INSURANCE | Source: Ambulatory Visit | Attending: General Surgery | Admitting: General Surgery

## 2013-04-13 ENCOUNTER — Other Ambulatory Visit: Payer: Self-pay

## 2013-04-13 DIAGNOSIS — K219 Gastro-esophageal reflux disease without esophagitis: Secondary | ICD-10-CM

## 2013-04-13 DIAGNOSIS — E039 Hypothyroidism, unspecified: Secondary | ICD-10-CM | POA: Insufficient documentation

## 2013-04-13 DIAGNOSIS — E785 Hyperlipidemia, unspecified: Secondary | ICD-10-CM | POA: Insufficient documentation

## 2013-04-13 DIAGNOSIS — I509 Heart failure, unspecified: Secondary | ICD-10-CM | POA: Insufficient documentation

## 2013-04-13 DIAGNOSIS — I25810 Atherosclerosis of coronary artery bypass graft(s) without angina pectoris: Secondary | ICD-10-CM

## 2013-04-13 DIAGNOSIS — I10 Essential (primary) hypertension: Secondary | ICD-10-CM

## 2013-04-13 DIAGNOSIS — Z6841 Body Mass Index (BMI) 40.0 and over, adult: Secondary | ICD-10-CM | POA: Insufficient documentation

## 2013-04-13 DIAGNOSIS — Z1231 Encounter for screening mammogram for malignant neoplasm of breast: Secondary | ICD-10-CM | POA: Insufficient documentation

## 2013-04-13 DIAGNOSIS — I2510 Atherosclerotic heart disease of native coronary artery without angina pectoris: Secondary | ICD-10-CM | POA: Insufficient documentation

## 2013-04-13 DIAGNOSIS — M1990 Unspecified osteoarthritis, unspecified site: Secondary | ICD-10-CM

## 2013-04-13 DIAGNOSIS — M1710 Unilateral primary osteoarthritis, unspecified knee: Secondary | ICD-10-CM | POA: Insufficient documentation

## 2013-04-13 DIAGNOSIS — E119 Type 2 diabetes mellitus without complications: Secondary | ICD-10-CM | POA: Insufficient documentation

## 2013-04-20 ENCOUNTER — Ambulatory Visit: Payer: PRIVATE HEALTH INSURANCE | Admitting: *Deleted

## 2013-04-25 ENCOUNTER — Telehealth: Payer: Self-pay | Admitting: Neurology

## 2013-04-25 NOTE — Telephone Encounter (Signed)
Called and spoke to pt, she had called late last week and stated that she had not heard anything from [facility] yet.  I called [facility] personally as we have had several orders routed through Epic not go through, they did not have the order.  I explained to pt that I thought this may have been what happened in this case.   Made them aware of the order and need to contact pt ASAP, also messaged Christeen Douglas regarding the order. -sh

## 2013-04-25 NOTE — Telephone Encounter (Signed)
Patient is calling to let us know, she has not received her CPAP machine.  She tells me she's called several times to speak with someone.  She can be reached at:  [phone] - she is seeing Dr. Vickey Huger next month and needs her CPAP before the appointment.

## 2013-04-26 NOTE — Telephone Encounter (Signed)
Gave message to sleep lab.

## 2013-04-26 NOTE — Telephone Encounter (Signed)
Called [facility] to check on the status of this order, they stated they actually tried to call the patient today to set up appointment.  They left a message and ask the pt to return their call.  I called and LM for [name] letting her know this information, also gave her [facility] phone number so she can contact them to arrange for setup.

## 2013-05-03 ENCOUNTER — Institutional Professional Consult (permissible substitution): Payer: PRIVATE HEALTH INSURANCE | Admitting: Pulmonary Disease

## 2013-05-12 LAB — HEMOGLOBIN A1C: Mean Plasma Glucose: 120 mg/dL — ABNORMAL HIGH (ref <117)

## 2013-05-12 LAB — COMPREHENSIVE METABOLIC PANEL
ALT: 10 U/L (ref 0–35)
AST: 12 U/L (ref 0–37)
Albumin: 4 g/dL (ref 3.5–5.2)
Calcium: 9.8 mg/dL (ref 8.4–10.5)
Chloride: 104 mEq/L (ref 96–112)
Potassium: 4.2 mEq/L (ref 3.5–5.3)
Sodium: 136 mEq/L (ref 135–145)
Total Protein: 6.7 g/dL (ref 6.0–8.3)

## 2013-05-12 LAB — CBC WITH DIFFERENTIAL/PLATELET
Basophils Absolute: 0 10*3/uL (ref 0.0–0.1)
Lymphocytes Relative: 33 % (ref 12–46)
Neutro Abs: 4.2 10*3/uL (ref 1.7–7.7)
Platelets: 438 10*3/uL — ABNORMAL HIGH (ref 150–400)
RBC: 4.36 MIL/uL (ref 3.87–5.11)
RDW: 13.7 % (ref 11.5–15.5)
WBC: 8 10*3/uL (ref 4.0–10.5)

## 2013-05-12 LAB — LIPID PANEL
HDL: 43 mg/dL (ref >39)
LDL Cholesterol: 70 mg/dL (ref 0–99)
Triglycerides: 114 mg/dL (ref <150)

## 2013-05-23 ENCOUNTER — Ambulatory Visit: Payer: PRIVATE HEALTH INSURANCE | Admitting: *Deleted

## 2013-05-26 ENCOUNTER — Institutional Professional Consult (permissible substitution): Payer: PRIVATE HEALTH INSURANCE | Admitting: Pulmonary Disease

## 2013-06-02 ENCOUNTER — Institutional Professional Consult (permissible substitution): Payer: Medicare Other | Admitting: Neurology

## 2013-06-08 ENCOUNTER — Telehealth (INDEPENDENT_AMBULATORY_CARE_PROVIDER_SITE_OTHER): Payer: Self-pay

## 2013-06-08 DIAGNOSIS — A048 Other specified bacterial intestinal infections: Secondary | ICD-10-CM

## 2013-06-08 NOTE — Telephone Encounter (Signed)
Called patient to discuss H-Pylori results (positive) gave patient information on H- Pylori infection and treatment.  Patient aware that we will send a PrevPac prescription over to her pharmacy ([facility]) patient aware to call our office when she has taking her last dose of the PrevPac so we can schedule her for a Breath Tek.  Patient verbalized understanding.

## 2013-06-09 ENCOUNTER — Other Ambulatory Visit (INDEPENDENT_AMBULATORY_CARE_PROVIDER_SITE_OTHER): Payer: Self-pay | Admitting: General Surgery

## 2013-06-09 MED ORDER — AMOXICILL-CLARITHRO-LANSOPRAZ PO MISC: Freq: Two times a day (BID) | ORAL | Status: DC

## 2013-06-09 MED ORDER — BIS SUBCIT-METRONID-TETRACYC 140-125-125 MG PO CAPS: 3.0000 | ORAL_CAPSULE | Freq: Three times a day (TID) | ORAL | Status: DC

## 2013-06-09 NOTE — Progress Notes (Signed)
Prev pac changed to Pylera due to drug interaction.  I spoke with the pharmacist and cancelled the prevpac.  We will change to pylera and individual components.

## 2013-06-09 NOTE — Telephone Encounter (Signed)
Patient called this morning to state that pharmacy did not have Prevpac prescription.  This RN called [facility] [phone] and re-called in the prescription for patient at this time.

## 2013-06-22 ENCOUNTER — Ambulatory Visit: Payer: PRIVATE HEALTH INSURANCE | Admitting: *Deleted

## 2013-06-23 ENCOUNTER — Encounter: Payer: Self-pay | Admitting: Neurology

## 2013-06-24 ENCOUNTER — Encounter: Payer: Self-pay | Admitting: Neurology

## 2013-06-24 ENCOUNTER — Ambulatory Visit (INDEPENDENT_AMBULATORY_CARE_PROVIDER_SITE_OTHER): Payer: Medicare Other | Admitting: Neurology

## 2013-06-24 VITALS — BP 120/75 | HR 53 | Resp 16 | Ht 63.0 in | Wt 322.0 lb

## 2013-06-24 DIAGNOSIS — J45909 Unspecified asthma, uncomplicated: Secondary | ICD-10-CM | POA: Insufficient documentation

## 2013-06-24 DIAGNOSIS — G4733 Obstructive sleep apnea (adult) (pediatric): Secondary | ICD-10-CM

## 2013-06-24 NOTE — Progress Notes (Signed)
[facility]  Provider:  Melvyn Novas, M D  Referring Provider: Georgianne Fick, MD Primary Care Physician:  Georgianne Fick, MD    HPI:  [name] is a 46 y.o. female  Is seen here for a sleep consultation , referred by  Dr. Nicholos Johns .  [name], d17T 63-year-old right-handed [place] American lady is presenting here today for a sleep consult after having undergone a split night polysomnography. She was referred by Dr. Nicholos Johns and her sleep study took place on 03-29-13 the patient's chief health problems is read as related to her a body mass index of 55.1 and she is considering weight loss surgery. She has associated problems with gouty arthritis, GERD, hypertension, shortness of breath and asthma, and she has become daytime excess of sleepy. She had reported to Dr. Nicholos Johns that she was snoring and felt un-refreshed when waking up in the morning. She endorsed the Epworth sleepiness score at 13 points at the time.  The patient stated that her at bedtime would be between 11 and 12 PM and that she would very frequently at night she had to the bathroom, she would wake up as a very dry mouth. She realized that her sleep was fragmented because her breathing trouble her she felt short of breath she also sometimes snort herself awake. She  truly slept at night maybe less than 5 hours.  [name] has a slight overbite a recessed chin, and his condition of retrognathia it another risk factor for obstructive sleep apnea and snoring.   She is unaware of any family history of sleep disorders. He has a very remote history of shift working over 10 years ago and not for very long time.   [name] is planning to undergo a weight loss surgery after many frustrating attempts at diet.  The sleep study documented the diagnosis of OHVS (Obesity hypoventilation syndrome )another condition related to her high body mass index.  In her sleep study the patient she did  not retain CO2 - capnography measure the highest CO2 level at 43.8 torr. AHI was 29.3 and the RDI 46.6. There was no REM sleep noted in the baseline part of a split-night study. Lowest oxygen level was 85% to 72.5 minutes. The patient was titrated to only 7 cm water she slept 409 minutes at this pressure and achieved 19 minutes in  REM sleep . The AHI was 0.0; she did not have periodic limb movements, she had a normal sleep architecture after CPAP was applied ,her EKG demonstrated normal sinus rhythm.She  has just [facility] , had  a download last week.   She reports that she now has a better, deeper sleep , reflected in restorative feeling , and in her daytime sleepiness score at  9 points , down from 13-14 . She noted more energy especially in the morning. She still feels very fatigued and her weight and her asthma has made it harder for her to have an active lifestyle.  PS : The patient also reported in addition to her above noted  health history, that she has documented coronary artery disease. She is an Ex -smoker , quit in 2007, substance use: Caffeine , one cup in AM, no ETOH.   There is no trauma or surgery history to upper airway , facial structures ENT or nose.  She had a thyroid surgery for "goiter" nodes.     Review of Systems: Out of a complete 14 system review, the patient  complains of only the following symptoms, and all other reviewed systems are negative. Dysphonia. Asthma SOB, knee pain, back pain.   History   Social History  . Marital Status: Divorced    Spouse Name: N/A    Number of Children: N/A  . Years of Education: N/A   Occupational History  . Not on file.   Social History Main Topics  . Smoking status: Former Smoker    Quit date: 03/17/2005  . Smokeless tobacco: Not on file  . Alcohol Use: No  . Drug Use: No  . Sexually Active: Not on file   Other Topics Concern  . Not on file   Social History Narrative  . No narrative on file     Family History  Problem Relation Age of Onset  . Coronary artery disease Sister   . Diabetes Brother   . Stroke Father   . Hypertension Other     Past Medical History  Diagnosis Date  . Hypertension   . Thyroid disease   . Diabetes mellitus without complication   . CHF (congestive heart failure)   . Arthritis   . Hyperlipidemia   . Morbidly obese     BMI 55  . Asthma in adult     Past Surgical History  Procedure Laterality Date  . Cholecystectomy  1985  . Shoulder Right 2011  . Hand surgery Left 2012  . Hand surgery    . Thyroid surgery  2002-2003  . Knee surgery    . Cesarean section  [phone]    Current Outpatient Prescriptions  Medication Sig Dispense Refill  . allopurinol (ZYLOPRIM) 100 MG tablet       . amLODipine (NORVASC) 10 MG tablet 10 mg daily.       Marland Kitchen aspirin 81 MG tablet Take 81 mg by mouth daily. Delayed release      . atenolol (TENORMIN) 25 MG tablet Take 25 mg by mouth daily.      Marland Kitchen bismuth-metronidazole-tetracycline (PYLERA) 140-125-125 MG per capsule Take 3 capsules by mouth 4 (four) times daily - after meals and at bedtime.  120 capsule  0  . COLCRYS 0.6 MG tablet       . CRESTOR 40 MG tablet daily.       Marland Kitchen DEXILANT 30 MG capsule daily.       Marland Kitchen levothyroxine (SYNTHROID, LEVOTHROID) 150 MCG tablet Take 150 mcg by mouth daily.      . nitroGLYCERIN (NITROLINGUAL) 0.4 MG/SPRAY spray Place 1 spray under the tongue as needed for chest pain. 1-2 sprays under the tongue as needed for 5 min.      Marland Kitchen omeprazole (PRILOSEC) 20 MG capsule Take 20 mg by mouth daily.      . OPANA ER, CRUSH RESISTANT, 40 MG T12A       . oxycodone (ROXICODONE) 30 MG immediate release tablet       . PENNSAID 2 % SOLN       . amoxicillin (AMOXIL) 500 MG capsule       . lansoprazole (PREVACID) 30 MG capsule       . levofloxacin (LEVAQUIN) 250 MG tablet        No current facility-administered medications for this visit.    Allergies as of 06/24/2013 - Review Complete  06/24/2013  Allergen Reaction Noted  . Ibuprofen  03/17/2013  . Lyrica (pregabalin)  03/17/2013    Vitals: BP 120/75  Pulse 53  Ht 5\' 3"  (1.6 m)  Wt 322 lb (146.058 kg)  BMI  57.05 kg/m2 Last Weight:  Wt Readings from Last 1 Encounters:  06/24/13 322 lb (146.058 kg)   Last Height:   Ht Readings from Last 1 Encounters:  06/24/13 5\' 3"  (1.6 m)    Physical exam:  General: The patient is awake, alert and appears not in acute distress and is well groomed. Head: Normocephalic, atraumatic. Neck is supple,  19 inches  , neck circumference:Mallampati 4  , the uvula not visible.  Cardiovascular:  Regular rate and rhythm , without  murmurs or carotid bruit, and without distended neck veins. Respiratory: Lungs are clear to auscultation. I cannot detect wheezing or ronci .  Skin:  Without evidence of edema, or rash Trunk: BMI is 56  elevated and patient  has normal posture.  Neurologic exam : The patient is awake and alert, oriented to place and time.  Memory subjective  described as intact.  There is a normal attention span & concentration ability. Speech is fluent with dysphonia . Mood and affect are appropriate.  Cranial nerves: Pupils are equal and briskly reactive to light. Funduscopic exam without evidence of pallor or edema.  Extraocular movements  in vertical and horizontal planes intact and without nystagmus. Visual fields by finger perimetry are intact. Hearing to finger rub intact.  Facial sensation intact to fine touch. Facial motor strength is symmetric and tongue and uvula move midline.  Motor exam:   Normal tone and normal muscle bulk and symmetric normal strength in all extremities.  Sensory:  Fine touch, pinprick and vibration were tested in all extremities. Proprioception is normal.  Coordination: Rapid alternating movements in the fingers/hands is tested and normal. Finger-to-nose maneuver  without evidence of ataxia, dysmetria or tremor.  Gait and station: Patient  walks without assistive device and gait  is wide based . DTR attenuated , not able to elicit achilles or patella reflexes.   Assessment [name] has and obesity related hypoventilation syndrome and is  retaining CO2. She has OSA . She has produced very long periods of desaturation, off her baseline sleep study 121 minutes of  Total sleep time and  72.5 minutes were in desaturation( Nadir  was 85%) at or below  89% of oxygen saturation. Her total arousal index was very high, associated with  a respiratory disturbance index -  Patient remained in  nonsupine position all night.  She has had a clinically good response to CPAP and she has been significantly less sleepy and more restored in daytime. She is motivated to. She understands that the weight loss surgery may be able to make the CPAP unnecessary. I am still waiting for the data download from her machine, but by the patient  reports she has been using the machine for at least 4-6 hours nightly. No compliance issue .  RV in 6 month, than yearly. If the patient undergoes weight loss surgery,  it is likely that she will need to be monitored for decreasing pressures. For that reason I prefer  auto titration machine with a pressure Window  -machine can be used after surgery - She is currently using only 7 cm water pressure, so I am optimistic that she will be able to d/c at one time CPAP therapy

## 2013-06-24 NOTE — Patient Instructions (Signed)
Sleep Apnea Sleep apnea is disorder that affects a person's sleep. A person with sleep apnea has abnormal pauses in their breathing when they sleep. It is hard for them to get a good sleep. This makes a person tired during the day. It also can lead to other physical problems. There are three types of sleep apnea. One type is when breathing stops for a short time because your airway is blocked (obstructive sleep apnea). Another type is when the brain sometimes fails to give the normal signal to breathe to the muscles that control your breathing (central sleep apnea). The third type is a combination of the other two types. HOME CARE  Do not sleep on your back. Try to sleep on your side.  Take all medicine as told by your doctor.  Avoid alcohol, calming medicines (sedatives), and depressant drugs.  Try to lose weight if you are overweight. Talk to your doctor about a healthy weight goal. Your doctor may have you use a device that helps to open your airway. It can help you get the air that you need. It is called a positive airway pressure (PAP) device. There are three types of PAP devices:  Continuous positive airway pressure (CPAP) device.  Nasal expiratory positive airway pressure (EPAP) device.  Bilevel positive airway pressure (BPAP) device. MAKE SURE YOU:  Understand these instructions.  Will watch your condition.  Will get help right away if you are not doing well or get worse. Document Released: 08/12/2008 Document Revised: 10/20/2012 Document Reviewed: 03/06/2012 [facility] Patient Information 2014 [place]. CPAP and BIPAP CPAP and BIPAP are methods of helping you breathe. CPAP stands for "continuous positive airway pressure." BIPAP stands for "bi-level positive airway pressure." Both CPAP and BIPAP are provided by a small machine with a flexible plastic tube that attaches to a plastic mask that goes over your nose or mouth. Air is blown into your air passages through your nose  or mouth. This helps to keep your airways open and helps to keep you breathing well. The amount of pressure that is used to blow the air into your air passages can be set on the machine. The pressure setting is based on your needs. With CPAP, the amount of pressure stays the same while you breathe in and out. With BIPAP, the amount of pressure changes when you inhale and exhale. Your caregiver will recommend whether CPAP or BIPAP would be more helpful for you.  CPAP and BIPAP can be helpful for both adults and children with:  Sleep apnea.  Chronic Obstructive Pulmonary Disease (COPD), a condition like emphysema.  Diseases which weaken the muscles of the chest such as muscular dystrophy or neurological diseases.  Other problems that cause breathing to be weak or difficult. USE OF CPAP OR BIPAP The respiratory therapist or technician will help you get used to wearing the mask. Some people feel claustrophobic (a trapped or closed in feeling) at first, because the mask needs to be fairly snug on your face.   It may help you to get used to the mask gradually, by first holding the mask loosely over your nose or mouth using a low pressure setting on the machine. Gradually the mask can be applied more snugly with increased pressure. You can also gradually increase the amount of time the mask is used.  People with sleep apnea will use the mask and machine at night when they are sleeping. Others, like those with ALS or other breathing difficulties, may need the CPAP or  BIPAP all the time.  If the first mask you try does not fit well, or is uncomfortable, there are other types and sizes that can be tried.  If you tend to breathe through your mouth, a chin strap may be applied to help keep your mouth closed (if you are using a nasal mask).  The CPAP and BIPAP machines have alarms that may sound if the mask comes off or develops a leak.  You should not eat or drink while the CPAP or BIPAP is on. Food or  fluids could get pushed into your lungs by the pressure of the CPAP or BIPAP. Sometimes CPAP or BIPAP machines are ordered for home use. If you are going to use the CPAP or BIPAP machine at home, follow these instructions  CPAP or BIPAP machines can be rented or purchased through home health care companies. There are many different brands of machines available. If you rent a machine before purchasing you may find which particular machine works well for you.  Ask questions if there is something you do not understand when picking out your machine.  Place your CPAP or BIPAP machine on a secure table or stand near an electrical outlet.  Know where the On/Off switch is.  Follow your doctor's instructions for how to set the pressure on your machine and when you should use it.  Do not smoke! Tobacco smoke residue can damage the machine. SEEK IMMEDIATE MEDICAL CARE IF:   You have redness or open areas around your nose or mouth.  You have trouble operating the CPAP or BIPAP machine.  You cannot tolerate wearing the CPAP or BIPAP mask.  You have any questions or concerns. Document Released: 08/01/2004 Document Revised: 01/26/2012 Document Reviewed: 10/31/2008 [facility] Patient Information 2014 [place], [place]. Obesity Obesity is having too much body fat and a body mass index (BMI) of 30 or more. BMI is a number based on your height and weight. The number is an estimate of how much body fat you have. Obesity can happen if you eat more calories than you can burn by exercising or other activity. It can cause major health problems or emergencies.  HOME CARE  Exercise and be active as told by your doctor. Try:  Using stairs when you can.  Parking farther away from store doors.  Gardening, biking, or walking.  Eat healthy foods and drinks that are low in calories. Eat more fruits and vegetables.  Limit fast food, sweets, and snack foods that are made with ingredients that are not natural  (processed food).  Eat smaller amounts of food.  Keep a journal and write down what you eat every day. Websites can help with this.  Avoid drinking alcohol. Drink more water and drinks without calories.   Take vitamins and dietary pills (supplements) only as told by your doctor.  Try going to weight-loss support groups or classes to help lessen stress. Dieticians and counselors may also help. GET HELP RIGHT AWAY IF:  You have chest pain or tightness.  You have trouble breathing or feel short of breath.  You feel weak or have loss of feeling (numbness) in your legs.  You feel confused or have trouble talking.  You have sudden changes in your vision. MAKE SURE YOU:  Understand these instructions.  Will watch your condition.  Will get help right away if you are not doing well or get worse. Document Released: 01/26/2012 Document Reviewed: 01/26/2012 [facility] Patient Information 2014 [place], [place].

## 2013-06-28 ENCOUNTER — Encounter: Payer: Self-pay | Admitting: Neurology

## 2013-06-30 ENCOUNTER — Telehealth (INDEPENDENT_AMBULATORY_CARE_PROVIDER_SITE_OTHER): Payer: Self-pay

## 2013-06-30 NOTE — Telephone Encounter (Signed)
Called patient with appointment date & time for Breath-Tek scheduled on 07/19/2013 @ 7:45 am @ [facility].  Patient to arrive @ 7:30 for registration.  Patient aware not to take any stomach (acid reducer) medications for the next 2 weeks.  Booking # [id number]

## 2013-07-19 ENCOUNTER — Encounter (HOSPITAL_COMMUNITY)
Admission: RE | Disposition: A | Discharge status: Home / Self Care | Payer: Self-pay | Source: Ambulatory Visit | Attending: General Surgery

## 2013-07-19 ENCOUNTER — Ambulatory Visit (HOSPITAL_COMMUNITY)
Admission: RE | Admit: 2013-07-19 | Discharge: 2013-07-19 | Disposition: A | Discharge status: Home / Self Care | Payer: PRIVATE HEALTH INSURANCE | Source: Ambulatory Visit | Attending: General Surgery | Admitting: General Surgery

## 2013-07-19 HISTORY — PX: BREATH TEK H PYLORI: SHX5422

## 2013-07-19 SURGERY — BREATH TEST, FOR HELICOBACTER PYLORI

## 2013-07-20 ENCOUNTER — Encounter (HOSPITAL_COMMUNITY): Payer: Self-pay | Admitting: General Surgery

## 2013-07-25 ENCOUNTER — Ambulatory Visit: Payer: PRIVATE HEALTH INSURANCE | Admitting: Dietician

## 2013-08-24 ENCOUNTER — Ambulatory Visit: Payer: PRIVATE HEALTH INSURANCE | Admitting: Dietician

## 2013-09-28 ENCOUNTER — Ambulatory Visit: Payer: PRIVATE HEALTH INSURANCE | Admitting: Dietician

## 2013-11-03 ENCOUNTER — Ambulatory Visit: Payer: PRIVATE HEALTH INSURANCE | Admitting: Dietician

## 2014-03-22 ENCOUNTER — Telehealth (HOSPITAL_COMMUNITY): Payer: Self-pay

## 2014-03-23 ENCOUNTER — Ambulatory Visit (INDEPENDENT_AMBULATORY_CARE_PROVIDER_SITE_OTHER): Payer: 59 | Admitting: Psychiatry

## 2014-03-23 ENCOUNTER — Encounter (INDEPENDENT_AMBULATORY_CARE_PROVIDER_SITE_OTHER): Payer: Self-pay

## 2014-03-23 ENCOUNTER — Encounter (HOSPITAL_COMMUNITY): Payer: Self-pay | Admitting: Psychiatry

## 2014-03-23 VITALS — BP 119/57 | HR 58 | Ht 64.0 in | Wt 329.4 lb

## 2014-03-23 DIAGNOSIS — F419 Anxiety disorder, unspecified: Secondary | ICD-10-CM

## 2014-03-23 DIAGNOSIS — F411 Generalized anxiety disorder: Secondary | ICD-10-CM
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
## 2014-04-03 ENCOUNTER — Ambulatory Visit (HOSPITAL_COMMUNITY): Payer: PRIVATE HEALTH INSURANCE | Admitting: Psychiatry

## 2014-05-18 ENCOUNTER — Ambulatory Visit (INDEPENDENT_AMBULATORY_CARE_PROVIDER_SITE_OTHER): Payer: 59 | Admitting: General Surgery

## 2014-06-07 ENCOUNTER — Other Ambulatory Visit (HOSPITAL_COMMUNITY): Payer: Self-pay | Admitting: Orthopedic Surgery

## 2014-06-08 ENCOUNTER — Encounter (HOSPITAL_COMMUNITY): Payer: Self-pay | Admitting: Pharmacy Technician

## 2014-06-09 ENCOUNTER — Encounter (INDEPENDENT_AMBULATORY_CARE_PROVIDER_SITE_OTHER): Payer: Self-pay | Admitting: Surgery

## 2014-06-09 ENCOUNTER — Ambulatory Visit (INDEPENDENT_AMBULATORY_CARE_PROVIDER_SITE_OTHER): Payer: 59 | Admitting: Surgery

## 2014-06-09 NOTE — Progress Notes (Signed)
Chief Complaint:  Morbid obesity BMI 56  History of Present Illness:  [name] is an 48 y.o. female who saw Dr. Lodema PilotBrian Layton last May in consultation for a bariatric operation. It is noted that she has had problems with hypertension, hyperlipidemia, hypothyroidism, coronary artery disease, and arthritis in her back and her knees. Currently she is not having any problem with GERD and she had as a result of his workup a negative upper GI series. She was interested in a gastric bypass but upon this revisit said she was considering both a bypass and a sleeve. I discussed both operations with her consent she were lysed on ibuprofen and aspirin I think a sleeve would be a very viable and attractive operation for her. Also she had a large right paramedian incision for a cholecystectomy and I think it is likely that she has significant adhesions H. Might preclude a bypass.  We will move ahead with permeating her for a sleeve gastrectomy. I explained the operation to her and its limitations and potential risk and she wants to go ahead and proceed with a sleeve gastrectomy. She denies any history of DVT. She is seeing Dr. Aldean BakerMarcus Duda regarding her left knee.    Past Medical History  Diagnosis Date  . Hypertension   . Thyroid disease   . Diabetes mellitus without complication   . CHF (congestive heart failure)   . Arthritis   . Hyperlipidemia   . Morbidly obese     BMI 55  . Asthma in adult     Past Surgical History  Procedure Laterality Date  . Cholecystectomy  1985  . Shoulder Right 2011  . Hand surgery Left 2012  . Hand surgery    . Thyroid surgery  2002-2003  . Knee surgery    . Cesarean section  [id number]  . Breath tek h pylori N/A 07/19/2013    Procedure: BREATH TEK H PYLORI;  Surgeon: Lodema PilotBrian Layton, DO;  Location: [facility];  Service: Endoscopy;  Laterality: N/A;    Current Outpatient Prescriptions  Medication Sig Dispense Refill  . amLODipine (NORVASC) 10 MG tablet Take 10 mg by  mouth daily.       Marland Kitchen. aspirin 81 MG tablet Take 81 mg by mouth daily. Delayed release      . atenolol (TENORMIN) 25 MG tablet Take 25 mg by mouth daily.      . colchicine 0.6 MG tablet       . CRESTOR 40 MG tablet Take 40 mg by mouth daily.       Marland Kitchen. DEXILANT 30 MG capsule       . fluticasone (FLONASE) 50 MCG/ACT nasal spray       . levothyroxine (SYNTHROID, LEVOTHROID) 150 MCG tablet Take 150 mcg by mouth daily.      . nitroGLYCERIN (NITROSTAT) 0.4 MG SL tablet Place 0.4 mg under the tongue every 5 (five) minutes as needed for chest pain.      . OPANA ER, CRUSH RESISTANT, 40 MG T12A       . oxycodone (ROXICODONE) 30 MG immediate release tablet       . PENNSAID 2 % SOLN       . phenytoin (DILANTIN) 30 MG ER capsule Take 30 mg by mouth daily.       No current facility-administered medications for this visit.   Ibuprofen and Lyrica Family History  Problem Relation Age of Onset  . Coronary artery disease Sister   . Diabetes Brother   .  Stroke Father   . Hypertension Other    Social History:   reports that she quit smoking about 9 years ago. Her smoking use included Cigarettes. She smoked 0.00 packs per day. She does not have any smokeless tobacco history on file. She reports that she does not drink alcohol or use illicit drugs.   REVIEW OF SYSTEMS : Positive for arthritis ; otherwise negative  Physical Exam:   Blood pressure 128/84, pulse 71, temperature 97.9 F (36.6 C), temperature source Temporal, resp. rate 18, height 5\' 4"  (1.626 m), weight 332 lb 6.4 oz (150.776 kg). Body mass index is 57.03 kg/(m^2).  Gen:  WDWN AAF NAD  Neurological: Alert and oriented to person, place, and time. Motor and sensory function is grossly intact  Head: Normocephalic and atraumatic.  Eyes: Conjunctivae are normal. Pupils are equal, round, and reactive to light. No scleral icterus.  Neck: Normal range of motion. Neck supple. No tracheal deviation or thyromegaly present.  Cardiovascular:  SR without  murmurs or gallops.  No carotid bruits Breast:  Not examined Respiratory: Effort normal.  No respiratory distress. No chest wall tenderness. Breath sounds normal.  No wheezes, rales or rhonchi.  Abdomen:  Obese with right paramedian incision and C section incision GU:  Not examined Musculoskeletal: Normal range of motion. Extremities are nontender. No cyanosis, edema or clubbing noted Lymphadenopathy: No cervical, preauricular, postauricular or axillary adenopathy is present Skin: Skin is warm and dry. No rash noted. No diaphoresis. No erythema. No pallor. Pscyh: Normal mood and affect. Behavior is normal. Judgment and thought content normal.   LABORATORY RESULTS: No results found for this or any previous visit (from the past 48 hour(s)).   RADIOLOGY RESULTS: No results found.  Problem List: Patient Active Problem List   Diagnosis Date Noted  . Morbidly obese   . Asthma in adult   . Obstructive sleep apnea (adult) (pediatric) 04/08/2013    Assessment & Plan: Morbid obesity with BMI 56;  Plan sleeve gastrectomy.  I have explained the procedure and risks to her in detail and she wants to move forward.      Matt B. Daphine Deutscher, MD, [facility] [phone] beeper [phone]  06/09/2014 11:03 AM

## 2014-06-09 NOTE — Patient Instructions (Signed)
Sleeve Gastrectomy A sleeve gastrectomy is a surgery in which a large portion of the stomach is removed. After the surgery, the stomach will be a narrow tube about the size of a banana. This surgery is performed to help a person lose weight. The person loses weight because the reduced size of the stomach restricts the amount of food that the person can eat. The stomach will hold much less food than before the surgery. Also, the part of the stomach that is removed produces a hormone that causes hunger.  This surgery is done for people who have morbid obesity, defined as a body mass index (BMI) greater than 40. BMI is an estimate of body fat and is calculated from the height and weight of a person. This surgery may also be done for people with a BMI between 35 and 40 if they have other diseases, such as type 2 diabetes mellitus, obstructive sleep apnea, or heart and lung disorders (cardiopulmonary diseases).  LET YOUR HEALTH CARE PROVIDER KNOW ABOUT:  Any allergies you have.   All medicines you are taking, including vitamins, herbs, eyedrops, creams, and over-the-counter medicines.   Use of steroids (by mouth or creams).   Previous problems you or members of your family have had with the use of anesthetics.   Any blood disorders you have.   Previous surgeries you have had.   Possibility of pregnancy, if this applies.   Other health problems you have. RISKS AND COMPLICATIONS Generally, sleeve gastrectomy is a safe procedure. However, as with any procedure, complications can occur. Possible complications include:  Infection.  Bleeding.  Blood clots.  Damage to other organs or tissue.  Leakage of fluid from the stomach into the abdominal cavity (rare). BEFORE THE PROCEDURE  You may need to have blood tests and imaging tests (such as X-rays or ultrasonography) done before the day of surgery. A test to evaluate your esophagus and how it moves (esophageal manometry) may also be  done.  You may be placed on a liquid diet 2-3 weeks before the surgery.  Ask your health care provider about changing or stopping your regular medicines.  Do not eat or drink anything for at least 8 hours before the procedure.   Make plans to have someone drive you home after your hospital stay. Also arrange for someone to help you with activities during recovery. PROCEDURE  A laparoscopic technique is usually used for this surgery:  You will be given medicine to make you sleep through the procedure (general anesthetic). This medicine will be given through an intravenous (IV) access tube that is put into one of your veins.  Once you are asleep, your abdomen will be cleaned and sterilized.  Several small incisions will be made in your abdomen.  Your abdomen will be filled with air so that it expands. This gives the surgeon more room to operate and makes your organs easier to see.  A thin, lighted tube with a tiny camera on the end (laparoscope) is put through a small incision in your abdomen. The camera on the laparoscope sends a picture to a TV screen in the operating room. This gives the surgeon a good view inside the abdomen.  Hollow tubes are put through the other small incisions in your abdomen. The tools needed for the procedure are put through these tubes.  The surgeon uses staples to divide part of the stomach and then removes it through one of the incisions.  The remaining stomach may be reinforced using stitches   or surgical glue or both to prevent leakage of the stomach contents. A small tube (drain) may be placed through one of the incisions to allow extra fluid to flow from the area.  The incisions are closed with stitches, staples, or glue. AFTER THE PROCEDURE  You will be monitored closely in a recovery area. Once the anesthetic has worn off, you will likely be moved to a regular hospital room.  You will be given medicine for pain and nausea.   You may have a drain  from one of the incisions in your abdomen. If a drain is used, it may stay in place after you go home from the hospital and be removed at a follow-up appointment.   You will be encouraged to walk around several times a day. This helps prevent blood clots.  You will be started on a liquid diet the first day after your surgery. Sometimes a test is done to check for leaking before you can eat.  You will be urged to cough and do deep breathing exercises. This helps prevent a lung infection after a surgery.  You will likely need to stay in the hospital for a few days.  Document Released: 08/31/2009 Document Revised: 07/06/2013 Document Reviewed: 03/18/2013 ExitCare Patient Information 2015 [employer]. This information is not intended to replace advice given to you by your health care provider. Make sure you discuss any questions you have with your health care provider.  

## 2014-06-13 ENCOUNTER — Encounter: Payer: PRIVATE HEALTH INSURANCE | Attending: Surgery | Admitting: Dietician

## 2014-06-13 ENCOUNTER — Encounter: Payer: Self-pay | Admitting: Dietician

## 2014-06-13 DIAGNOSIS — Z713 Dietary counseling and surveillance: Secondary | ICD-10-CM | POA: Diagnosis not present

## 2014-06-13 DIAGNOSIS — Z6841 Body Mass Index (BMI) 40.0 and over, adult: Secondary | ICD-10-CM | POA: Diagnosis not present

## 2014-06-13 NOTE — Patient Instructions (Signed)
Follow Pre-Op Goals Try Protein Shakes Attend Pre Op Class on 8/3.

## 2014-06-13 NOTE — Progress Notes (Signed)
  Pre-Op Assessment Visit:  Pre-Operative Sleeve Gastrectomy Surgery  Medical Nutrition Therapy:  Appt start time: 1000   End time:  1045.  Patient was seen on 06/13/2014 for Pre-Operative Sleeve Gastrectomy Nutrition Assessment. Assessment and letter of approval [facility] coordinator on 06/13/2014.   Preferred Learning Style:   No preference indicated   Learning Readiness:   Ready  Handouts given during visit include:  Pre-Op Goals Bariatric Surgery Protein Shakes  Teaching Method Utilized:  Visual Auditory Hands on  Barriers to learning/adherence to lifestyle change: high carb/high fat diet   Demonstrated degree of understanding via:  Teach Back   Patient to call the [facility] to enroll in Pre-Op and Post-Op Nutrition Education when surgery date is scheduled.

## 2014-06-14 ENCOUNTER — Encounter (HOSPITAL_COMMUNITY)
Admission: RE | Admit: 2014-06-14 | Discharge: 2014-06-14 | Disposition: A | Discharge status: Home / Self Care | Payer: PRIVATE HEALTH INSURANCE | Source: Ambulatory Visit | Attending: Orthopedic Surgery | Admitting: Orthopedic Surgery

## 2014-06-14 ENCOUNTER — Encounter (HOSPITAL_COMMUNITY)
Admission: RE | Admit: 2014-06-14 | Discharge: 2014-06-14 | Disposition: A | Discharge status: Home / Self Care | Payer: PRIVATE HEALTH INSURANCE | Source: Ambulatory Visit | Attending: Anesthesiology | Admitting: Anesthesiology

## 2014-06-14 ENCOUNTER — Other Ambulatory Visit (INDEPENDENT_AMBULATORY_CARE_PROVIDER_SITE_OTHER): Payer: Self-pay | Admitting: Surgery

## 2014-06-14 ENCOUNTER — Encounter (HOSPITAL_COMMUNITY): Payer: Self-pay

## 2014-06-14 DIAGNOSIS — I10 Essential (primary) hypertension: Secondary | ICD-10-CM | POA: Diagnosis not present

## 2014-06-14 DIAGNOSIS — Z87891 Personal history of nicotine dependence: Secondary | ICD-10-CM | POA: Diagnosis not present

## 2014-06-14 DIAGNOSIS — M23302 Other meniscus derangements, unspecified lateral meniscus, unspecified knee: Secondary | ICD-10-CM | POA: Diagnosis present

## 2014-06-14 DIAGNOSIS — G4730 Sleep apnea, unspecified: Secondary | ICD-10-CM | POA: Diagnosis not present

## 2014-06-14 DIAGNOSIS — M2240 Chondromalacia patellae, unspecified knee: Secondary | ICD-10-CM | POA: Diagnosis not present

## 2014-06-14 DIAGNOSIS — Z6841 Body Mass Index (BMI) 40.0 and over, adult: Secondary | ICD-10-CM | POA: Diagnosis not present

## 2014-06-14 DIAGNOSIS — M23329 Other meniscus derangements, posterior horn of medial meniscus, unspecified knee: Secondary | ICD-10-CM | POA: Diagnosis not present

## 2014-06-14 DIAGNOSIS — E890 Postprocedural hypothyroidism: Secondary | ICD-10-CM | POA: Diagnosis not present

## 2014-06-14 DIAGNOSIS — M1710 Unilateral primary osteoarthritis, unspecified knee: Secondary | ICD-10-CM | POA: Diagnosis not present

## 2014-06-14 HISTORY — DX: Hypothyroidism, unspecified: E03.9

## 2014-06-14 HISTORY — DX: Sleep apnea, unspecified: G47.30

## 2014-06-14 HISTORY — DX: Gastro-esophageal reflux disease without esophagitis: K21.9

## 2014-06-14 LAB — COMPREHENSIVE METABOLIC PANEL
ALBUMIN: 4 g/dL (ref 3.5–5.2)
ALT: 12 U/L (ref 0–35)
AST: 16 U/L (ref 0–37)
Alkaline Phosphatase: 102 U/L (ref 39–117)
Anion gap: 17 — ABNORMAL HIGH (ref 5–15)
BUN: 7 mg/dL (ref 6–23)
CALCIUM: 9.9 mg/dL (ref 8.4–10.5)
CO2: 21 mEq/L (ref 19–32)
CREATININE: 0.59 mg/dL (ref 0.50–1.10)
Chloride: 102 mEq/L (ref 96–112)
GFR calc Af Amer: >90 mL/min (ref >90)
Glucose, Bld: 91 mg/dL (ref 70–99)
Potassium: 4.2 mEq/L (ref 3.7–5.3)
Sodium: 140 mEq/L (ref 137–147)
Total Bilirubin: 0.6 mg/dL (ref 0.3–1.2)
Total Protein: 7.5 g/dL (ref 6.0–8.3)

## 2014-06-14 LAB — CBC
HCT: 43.6 % (ref 36.0–46.0)
Hemoglobin: 14.5 g/dL (ref 12.0–15.0)
MCH: 31.5 pg (ref 26.0–34.0)
MCHC: 33.3 g/dL (ref 30.0–36.0)
MCV: 94.8 fL (ref 78.0–100.0)
PLATELETS: 403 10*3/uL — AB (ref 150–400)
RBC: 4.6 MIL/uL (ref 3.87–5.11)
RDW: 13.7 % (ref 11.5–15.5)
WBC: 9.5 10*3/uL (ref 4.0–10.5)

## 2014-06-14 LAB — PROTIME-INR
INR: 1.03 (ref 0.00–1.49)
PROTHROMBIN TIME: 13.5 s (ref 11.6–15.2)

## 2014-06-14 LAB — APTT: aPTT: 32 seconds (ref 24–37)

## 2014-06-14 LAB — HCG, SERUM, QUALITATIVE: Preg, Serum: NEGATIVE

## 2014-06-14 NOTE — Pre-Procedure Instructions (Signed)
[name]  06/14/2014   Your procedure is scheduled on:  Friday, July 31st  [facility] Admitting at 1115 AM.  Call this number if you have problems the morning of surgery: [phone]   Remember:   Do not eat food or drink liquids after midnight.   Take these medicines the morning of surgery with A SIP OF WATER: norvasc, atenolol, flonase, synthroid, oxycodone if needed, dilantin, dexilant   Do not wear jewelry, make-up or nail polish.  Do not wear lotions, powders, or perfumes. You may wear deodorant.  Do not shave 48 hours prior to surgery. Men may shave face and neck.  Do not bring valuables to the hospital.  [facility] not [facility] for any belongings or valuables.               Contacts, dentures or bridgework may not be worn into surgery.  Leave suitcase in the car. After surgery it may be brought to your room.  For patients admitted to the hospital, discharge time is determined by your treatment team.               Patients discharged the day of surgery will not be allowed to drive home.  Please read over the following fact sheets that you were given: Pain Booklet, Coughing and Deep Breathing and Surgical Site Infection Prevention [place] - Preparing for Surgery  Before surgery, you can play an important role.  Because skin is not sterile, your skin needs to be as free of germs as possible.  You can reduce the number of germs on you skin by washing with CHG (chlorahexidine gluconate) soap before surgery.  CHG is an antiseptic cleaner which kills germs and bonds with the skin to continue killing germs even after washing.  Please DO NOT use if you have an allergy to CHG or antibacterial soaps.  If your skin becomes reddened/irritated stop using the CHG and inform your nurse when you arrive at Short Stay.  Do not shave (including legs and underarms) for at least 48 hours prior to the first CHG shower.  You may shave your face.  Please follow  these instructions carefully:   1.  Shower with CHG Soap the night before surgery and the morning of Surgery.  2.  If you choose to wash your hair, wash your hair first as usual with your normal shampoo.  3.  After you shampoo, rinse your hair and body thoroughly to remove the shampoo.  4.  Use CHG as you would any other liquid soap.  You can apply CHG directly to the skin and wash gently with scrungie or a clean washcloth.  5.  Apply the CHG Soap to your body ONLY FROM THE NECK DOWN.  Do not use on open wounds or open sores.  Avoid contact with your eyes, ears, mouth and genitals (private parts).  Wash genitals (private parts) with your normal soap.  6.  Wash thoroughly, paying special attention to the area where your surgery will be performed.  7.  Thoroughly rinse your body with warm water from the neck down.  8.  DO NOT shower/wash with your normal soap after using and rinsing off the CHG Soap.  9.  Pat yourself dry with a clean towel.            10.  Wear clean pajamas.            11.  Place clean sheets on your bed  the night of your first shower and do not sleep with pets.  Day of Surgery  Do not apply any lotions/deoderants the morning of surgery.  Please wear clean clothes to the hospital/surgery center.

## 2014-06-14 NOTE — Progress Notes (Signed)
Primary - dr. Nicholos Johnsramachandran Cardiologist - dr. Jacinto Halimganji Stress test and ekg about 6 months ago. (preop for bariatric surgery)

## 2014-06-15 ENCOUNTER — Encounter (HOSPITAL_COMMUNITY): Payer: Self-pay | Admitting: Pharmacy Technician

## 2014-06-15 MED ORDER — HEPARIN SODIUM (PORCINE) 5000 UNIT/ML IJ SOLN: 5000.0000 [IU] | INTRAMUSCULAR | Status: DC

## 2014-06-15 MED ORDER — DEXTROSE 5 % IV SOLN
2.0000 g | INTRAVENOUS | Status: DC
  Filled 2014-06-15: qty 2

## 2014-06-15 MED ORDER — CHLORHEXIDINE GLUCONATE CLOTH 2 % EX PADS: 6.0000 | MEDICATED_PAD | Freq: Once | CUTANEOUS | Status: DC

## 2014-06-15 MED ORDER — CEFAZOLIN SODIUM 10 G IJ SOLR
3.0000 g | INTRAMUSCULAR | Status: AC
  Administered 2014-06-16: 3 g via INTRAVENOUS
  Filled 2014-06-15: qty 3000

## 2014-06-16 ENCOUNTER — Encounter (HOSPITAL_COMMUNITY): Payer: Self-pay | Admitting: Surgery

## 2014-06-16 ENCOUNTER — Ambulatory Visit (HOSPITAL_COMMUNITY)
Admission: RE | Admit: 2014-06-16 | Discharge: 2014-06-16 | Disposition: A | Discharge status: Home / Self Care | Payer: PRIVATE HEALTH INSURANCE | Source: Ambulatory Visit | Attending: Orthopedic Surgery | Admitting: Orthopedic Surgery

## 2014-06-16 ENCOUNTER — Encounter (HOSPITAL_COMMUNITY)
Admission: RE | Disposition: A | Discharge status: Home / Self Care | Payer: Self-pay | Source: Ambulatory Visit | Attending: Orthopedic Surgery

## 2014-06-16 ENCOUNTER — Ambulatory Visit (HOSPITAL_COMMUNITY): Payer: PRIVATE HEALTH INSURANCE | Admitting: Anesthesiology

## 2014-06-16 ENCOUNTER — Encounter (HOSPITAL_COMMUNITY): Payer: PRIVATE HEALTH INSURANCE | Admitting: Anesthesiology

## 2014-06-16 DIAGNOSIS — M1710 Unilateral primary osteoarthritis, unspecified knee: Secondary | ICD-10-CM | POA: Insufficient documentation

## 2014-06-16 DIAGNOSIS — M23329 Other meniscus derangements, posterior horn of medial meniscus, unspecified knee: Secondary | ICD-10-CM | POA: Diagnosis not present

## 2014-06-16 DIAGNOSIS — Z87891 Personal history of nicotine dependence: Secondary | ICD-10-CM | POA: Insufficient documentation

## 2014-06-16 DIAGNOSIS — Z6841 Body Mass Index (BMI) 40.0 and over, adult: Secondary | ICD-10-CM | POA: Insufficient documentation

## 2014-06-16 DIAGNOSIS — S838X2A Sprain of other specified parts of left knee, initial encounter: Secondary | ICD-10-CM

## 2014-06-16 DIAGNOSIS — E890 Postprocedural hypothyroidism: Secondary | ICD-10-CM | POA: Insufficient documentation

## 2014-06-16 DIAGNOSIS — M2240 Chondromalacia patellae, unspecified knee: Secondary | ICD-10-CM | POA: Insufficient documentation

## 2014-06-16 DIAGNOSIS — M23302 Other meniscus derangements, unspecified lateral meniscus, unspecified knee: Secondary | ICD-10-CM | POA: Insufficient documentation

## 2014-06-16 DIAGNOSIS — I10 Essential (primary) hypertension: Secondary | ICD-10-CM | POA: Insufficient documentation

## 2014-06-16 DIAGNOSIS — G4730 Sleep apnea, unspecified: Secondary | ICD-10-CM | POA: Insufficient documentation

## 2014-06-16 HISTORY — PX: KNEE ARTHROSCOPY: SHX127

## 2014-06-16 SURGERY — ARTHROSCOPY, KNEE
Anesthesia: General | Site: Knee | Laterality: Left

## 2014-06-16 MED ORDER — FENTANYL CITRATE 0.05 MG/ML IJ SOLN
INTRAMUSCULAR | Status: DC | PRN
Start: 2014-06-16 — End: 2014-06-16
  Administered 2014-06-16 (×2): 50 ug via INTRAVENOUS
  Administered 2014-06-16: 100 ug via INTRAVENOUS
  Administered 2014-06-16 (×2): 50 ug via INTRAVENOUS

## 2014-06-16 MED ORDER — SUCCINYLCHOLINE CHLORIDE 20 MG/ML IJ SOLN
INTRAMUSCULAR | Status: DC | PRN
  Administered 2014-06-16: 140 mg via INTRAVENOUS

## 2014-06-16 MED ORDER — MORPHINE SULFATE 4 MG/ML IJ SOLN
4.0000 mg | Freq: Once | INTRAMUSCULAR | Status: AC
  Administered 2014-06-16: 4 mg via INTRAVENOUS

## 2014-06-16 MED ORDER — LACTATED RINGERS IV SOLN
INTRAVENOUS | Status: DC
  Administered 2014-06-16: 13:00:00 via INTRAVENOUS

## 2014-06-16 MED ORDER — HYDROMORPHONE HCL PF 1 MG/ML IJ SOLN
0.2500 mg | INTRAMUSCULAR | Status: DC | PRN
  Administered 2014-06-16 (×2): 0.5 mg via INTRAVENOUS

## 2014-06-16 MED ORDER — LIDOCAINE HCL (CARDIAC) 20 MG/ML IV SOLN
INTRAVENOUS | Status: DC | PRN
  Administered 2014-06-16: 50 mg via INTRAVENOUS

## 2014-06-16 MED ORDER — MORPHINE SULFATE 4 MG/ML IJ SOLN
INTRAMUSCULAR | Status: AC
  Administered 2014-06-16: 4 mg via INTRAVENOUS
  Filled 2014-06-16: qty 1

## 2014-06-16 MED ORDER — ARTIFICIAL TEARS OP OINT
TOPICAL_OINTMENT | OPHTHALMIC | Status: AC
  Filled 2014-06-16: qty 3.5

## 2014-06-16 MED ORDER — OXYCODONE-ACETAMINOPHEN 5-325 MG PO TABS: 1.0000 | ORAL_TABLET | ORAL | Status: AC | PRN

## 2014-06-16 MED ORDER — FENTANYL CITRATE 0.05 MG/ML IJ SOLN
INTRAMUSCULAR | Status: AC
  Filled 2014-06-16: qty 5

## 2014-06-16 MED ORDER — MIDAZOLAM HCL 2 MG/2ML IJ SOLN
INTRAMUSCULAR | Status: AC
  Filled 2014-06-16: qty 2

## 2014-06-16 MED ORDER — ONDANSETRON HCL 4 MG/2ML IJ SOLN
INTRAMUSCULAR | Status: AC
  Filled 2014-06-16: qty 2

## 2014-06-16 MED ORDER — OXYCODONE-ACETAMINOPHEN 5-325 MG PO TABS
1.0000 | ORAL_TABLET | Freq: Once | ORAL | Status: AC
  Administered 2014-06-16: 1 via ORAL

## 2014-06-16 MED ORDER — PROPOFOL 10 MG/ML IV BOLUS
INTRAVENOUS | Status: AC
  Filled 2014-06-16: qty 20

## 2014-06-16 MED ORDER — OXYCODONE HCL 5 MG PO TABS: 5.0000 mg | ORAL_TABLET | Freq: Once | ORAL | Status: DC | PRN

## 2014-06-16 MED ORDER — PROPOFOL 10 MG/ML IV BOLUS
INTRAVENOUS | Status: DC | PRN
  Administered 2014-06-16: 180 mg via INTRAVENOUS

## 2014-06-16 MED ORDER — KETOROLAC TROMETHAMINE 30 MG/ML IJ SOLN
INTRAMUSCULAR | Status: AC
  Filled 2014-06-16: qty 1

## 2014-06-16 MED ORDER — SODIUM CHLORIDE 0.9 % IR SOLN
Status: DC | PRN
  Administered 2014-06-16 (×3): 3000 mL
  Administered 2014-06-16: 1000 mL

## 2014-06-16 MED ORDER — ONDANSETRON HCL 4 MG/2ML IJ SOLN: 4.0000 mg | Freq: Once | INTRAMUSCULAR | Status: DC | PRN

## 2014-06-16 MED ORDER — OXYCODONE HCL 5 MG/5ML PO SOLN: 5.0000 mg | Freq: Once | ORAL | Status: DC | PRN

## 2014-06-16 MED ORDER — MORPHINE SULFATE 2 MG/ML IJ SOLN: 1.0000 mg | INTRAMUSCULAR | Status: DC | PRN

## 2014-06-16 MED ORDER — KETOROLAC TROMETHAMINE 30 MG/ML IJ SOLN
30.0000 mg | Freq: Once | INTRAMUSCULAR | Status: AC
  Administered 2014-06-16: 30 mg via INTRAVENOUS

## 2014-06-16 MED ORDER — ARTIFICIAL TEARS OP OINT
TOPICAL_OINTMENT | OPHTHALMIC | Status: DC | PRN
  Administered 2014-06-16: 1 via OPHTHALMIC

## 2014-06-16 MED ORDER — GLYCOPYRROLATE 0.2 MG/ML IJ SOLN
INTRAMUSCULAR | Status: AC
  Filled 2014-06-16: qty 2

## 2014-06-16 MED ORDER — HYDROMORPHONE HCL PF 1 MG/ML IJ SOLN
INTRAMUSCULAR | Status: AC
  Filled 2014-06-16: qty 1

## 2014-06-16 MED ORDER — NEOSTIGMINE METHYLSULFATE 10 MG/10ML IV SOLN
INTRAVENOUS | Status: AC
  Filled 2014-06-16: qty 1

## 2014-06-16 MED ORDER — LACTATED RINGERS IV SOLN
INTRAVENOUS | Status: DC | PRN
  Administered 2014-06-16 (×2): via INTRAVENOUS

## 2014-06-16 MED ORDER — ONDANSETRON HCL 4 MG/2ML IJ SOLN
INTRAMUSCULAR | Status: DC | PRN
  Administered 2014-06-16: 4 mg via INTRAVENOUS

## 2014-06-16 MED ORDER — MIDAZOLAM HCL 5 MG/5ML IJ SOLN
INTRAMUSCULAR | Status: DC | PRN
  Administered 2014-06-16: 1 mg via INTRAVENOUS

## 2014-06-16 MED ORDER — OXYCODONE-ACETAMINOPHEN 5-325 MG PO TABS
ORAL_TABLET | ORAL | Status: AC
  Filled 2014-06-16: qty 1

## 2014-06-16 MED ORDER — ROCURONIUM BROMIDE 50 MG/5ML IV SOLN
INTRAVENOUS | Status: AC
  Filled 2014-06-16: qty 1

## 2014-06-16 SURGICAL SUPPLY — 32 items
BLADE CUDA 5.5 (BLADE) IMPLANT
BLADE GREAT WHITE 4.2 (BLADE) ×2 IMPLANT
BNDG COHESIVE 6X5 TAN STRL LF (GAUZE/BANDAGES/DRESSINGS) ×2 IMPLANT
BNDG GAUZE ELAST 4 BULKY (GAUZE/BANDAGES/DRESSINGS) ×2 IMPLANT
BUR OVAL 6.0 (BURR) IMPLANT
COVER SURGICAL LIGHT HANDLE (MISCELLANEOUS) ×2 IMPLANT
CUFF TOURNIQUET SINGLE 34IN LL (TOURNIQUET CUFF) IMPLANT
CUFF TOURNIQUET SINGLE 44IN (TOURNIQUET CUFF) IMPLANT
DRAPE ARTHROSCOPY W/POUCH 114 (DRAPES) ×2 IMPLANT
DRAPE U-SHAPE 47X51 STRL (DRAPES) ×2 IMPLANT
DRSG EMULSION OIL 3X3 NADH (GAUZE/BANDAGES/DRESSINGS) ×2 IMPLANT
DRSG PAD ABDOMINAL 8X10 ST (GAUZE/BANDAGES/DRESSINGS) ×2 IMPLANT
DURAPREP 26ML APPLICATOR (WOUND CARE) ×2 IMPLANT
GLOVE SURG ORTHO 9.0 STRL STRW (GLOVE) ×6 IMPLANT
GOWN STRL REUS W/ TWL XL LVL3 (GOWN DISPOSABLE) ×3 IMPLANT
GOWN STRL REUS W/TWL XL LVL3 (GOWN DISPOSABLE) ×3
KIT BASIN OR (CUSTOM PROCEDURE TRAY) ×2 IMPLANT
KIT ROOM TURNOVER OR (KITS) ×2 IMPLANT
MANIFOLD NEPTUNE II (INSTRUMENTS) ×2 IMPLANT
NEEDLE 18GX1X1/2 (RX/OR ONLY) (NEEDLE) ×2 IMPLANT
PACK ARTHROSCOPY DSU (CUSTOM PROCEDURE TRAY) ×2 IMPLANT
PAD ARMBOARD 7.5X6 YLW CONV (MISCELLANEOUS) ×4 IMPLANT
PADDING CAST COTTON 6X4 STRL (CAST SUPPLIES) ×2 IMPLANT
SET ARTHROSCOPY TUBING (MISCELLANEOUS) ×1
SET ARTHROSCOPY TUBING LN (MISCELLANEOUS) ×1 IMPLANT
SPONGE GAUZE 4X4 12PLY STER LF (GAUZE/BANDAGES/DRESSINGS) ×2 IMPLANT
SUT ETHILON 4 0 PS 2 18 (SUTURE) ×2 IMPLANT
SYR 20CC LL (SYRINGE) ×2 IMPLANT
TOWEL OR 17X24 6PK STRL BLUE (TOWEL DISPOSABLE) ×4 IMPLANT
TUBE CONNECTING 12X1/4 (SUCTIONS) ×2 IMPLANT
WAND HAND CNTRL MULTIVAC 90 (MISCELLANEOUS) IMPLANT
WATER STERILE IRR 1000ML POUR (IV SOLUTION) ×2 IMPLANT

## 2014-06-16 NOTE — H&P (Signed)
[name] is an 48 y.o. female.   Chief Complaint: Osteoarthritis left knee with meniscal pathology  HPI: Patient is a 48 year old woman with osteoarthritis of her left knee with mechanical symptoms from meniscal tears who has failed conservative care and wished to proceed with arthroscopic intervention  Past Medical History  Diagnosis Date  . Hypertension   . Thyroid disease   . Arthritis   . Hyperlipidemia   . Morbidly obese     BMI 55  . Asthma in adult   . Sleep apnea     cpap  . Hypothyroidism   . GERD (gastroesophageal reflux disease)     Past Surgical History  Procedure Laterality Date  . Cholecystectomy  1985  . Shoulder Right 2011  . Hand surgery Left 2012  . Hand surgery    . Thyroid surgery  2002-2003  . Cesarean section  [id number]  . Breath tek h pylori N/A 07/19/2013    Procedure: BREATH TEK H PYLORI;  Surgeon: Madilyn Hook, DO;  Location: [facility];  Service: Endoscopy;  Laterality: N/A;    Family History  Problem Relation Age of Onset  . Coronary artery disease Sister   . Diabetes Brother   . Stroke Father   . Hypertension Other    Social History:  reports that she quit smoking about 9 years ago. Her smoking use included Cigarettes. She smoked 0.00 packs per day. She does not have any smokeless tobacco history on file. She reports that she does not drink alcohol or use illicit drugs.  Allergies:  Allergies  Allergen Reactions  . Ibuprofen     Nausea  . Lyrica [Pregabalin]     Trouble urinating  . Tape Rash    No prescriptions prior to admission    Results for orders placed during the hospital encounter of 06/14/14 (from the past 48 hour(s))  HCG, SERUM, QUALITATIVE     Status: None   Collection Time    06/14/14  4:00 PM      Result Value Ref Range   Preg, Serum NEGATIVE  NEGATIVE   Comment:            THE SENSITIVITY OF THIS     METHODOLOGY IS >10 mIU/mL.  APTT     Status: None   Collection Time    06/14/14  4:00 PM      Result  Value Ref Range   aPTT 32  24 - 37 seconds  CBC     Status: Abnormal   Collection Time    06/14/14  4:00 PM      Result Value Ref Range   WBC 9.5  4.0 - 10.5 K/uL   RBC 4.60  3.87 - 5.11 MIL/uL   Hemoglobin 14.5  12.0 - 15.0 g/dL   HCT 43.6  36.0 - 46.0 %   MCV 94.8  78.0 - 100.0 fL   MCH 31.5  26.0 - 34.0 pg   MCHC 33.3  30.0 - 36.0 g/dL   RDW 13.7  11.5 - 15.5 %   Platelets 403 (*) 150 - 400 K/uL  COMPREHENSIVE METABOLIC PANEL     Status: Abnormal   Collection Time    06/14/14  4:00 PM      Result Value Ref Range   Sodium 140  137 - 147 mEq/L   Potassium 4.2  3.7 - 5.3 mEq/L   Chloride 102  96 - 112 mEq/L   CO2 21  19 - 32 mEq/L   Glucose, Bld 91  70 - 99 mg/dL   BUN 7  6 - 23 mg/dL   Creatinine, Ser 0.59  0.50 - 1.10 mg/dL   Calcium 9.9  8.4 - 10.5 mg/dL   Total Protein 7.5  6.0 - 8.3 g/dL   Albumin 4.0  3.5 - 5.2 g/dL   AST 16  0 - 37 U/L   ALT 12  0 - 35 U/L   Alkaline Phosphatase 102  39 - 117 U/L   Total Bilirubin 0.6  0.3 - 1.2 mg/dL   GFR calc non Af Amer >90  >90 mL/min   GFR calc Af Amer >90  >90 mL/min   Comment: (NOTE)     The eGFR has been calculated using the CKD EPI equation.     This calculation has not been validated in all clinical situations.     eGFR's persistently <90 mL/min signify possible Chronic Kidney     Disease.   Anion gap 17 (*) 5 - 15  PROTIME-INR     Status: None   Collection Time    06/14/14  4:00 PM      Result Value Ref Range   Prothrombin Time 13.5  11.6 - 15.2 seconds   INR 1.03  0.00 - 1.49   Dg Chest 2 View  06/14/2014   CLINICAL DATA:  Hypertension. Preoperative for knee replacement surgery.  EXAM: CHEST  2 VIEW  COMPARISON:  01/05/2014  FINDINGS: Minimal lingular scarring. Cardiothoracic index 59% compatible with mild to moderate cardiomegaly. No edema. The lungs appear otherwise clear.  No pleural effusion. Clips along the right thoracic inlet, compatible with prior right thyroidectomy.  IMPRESSION: 1. Mild to moderate  enlargement of the cardiopericardial silhouette. 2. Mild lingular scarring.   Electronically Signed   By: Sherryl Barters M.D.   On: 06/14/2014 16:51    Review of Systems  All other systems reviewed and are negative.   There were no vitals taken for this visit. Physical Exam  On examination patient has tenderness to palpation over the medial and lateral joint line. Flexion and internal and external rotation reproduces pain. Clausen cruciates are stable. Assessment/Plan Assessment: Osteoarthritis with meniscal pathology left knee.  Plan: We'll plan for left knee arthroscopy. Risks and benefits were discussed including infection neurovascular injury persistent pain need for additional surgery. Patient states she understands and wished to proceed at this time.  SuzanneMARCUS [name] 06/16/2014, 7:04 AM

## 2014-06-16 NOTE — Progress Notes (Signed)
Pt does not want crutches-wants to use her cane at home. Dr Lajoyce Cornersuda aware.

## 2014-06-16 NOTE — Transfer of Care (Signed)
Immediate Anesthesia Transfer of Care Note  Patient: [name]  Procedure(s) Performed: Procedure(s) with comments: ARTHROSCOPY KNEE (Left) - Left Knee Arthroscopy and Debridement and chrondraplasty  Patient Location: PACU  Anesthesia Type:General  Level of Consciousness: awake, alert  and oriented  Airway & Oxygen Therapy: Patient Spontanous Breathing and Patient connected to face mask oxygen  Post-op Assessment: Report given to PACU RN  Post vital signs: Reviewed and stable  Complications: No apparent anesthesia complications

## 2014-06-16 NOTE — Op Note (Signed)
06/16/2014  5:10 PM  PATIENT:  [name]    PRE-OPERATIVE DIAGNOSIS:  lateral meniscus tear Left knee  POST-OPERATIVE DIAGNOSIS:  Medial and lateral meniscal tear left knee  PROCEDURE:  ARTHROSCOPY KNEE partial excision medial lateral meniscus. Abrasion chondroplasty medial femoral condyle lateral femoral condyle patella and trochlea.  SURGEON:  Nadara MustardUDA,Laurelyn Terrero V, MD  PHYSICIAN ASSISTANT:None ANESTHESIA:   General  PREOPERATIVE INDICATIONS:  [name] is a  48 y.o. female with a diagnosis of lateral meniscus tear Left knee who failed conservative measures and elected for surgical management.    The risks benefits and alternatives were discussed with the patient preoperatively including but not limited to the risks of infection, bleeding, nerve injury, cardiopulmonary complications, the need for revision surgery, among others, and the patient was willing to proceed.  OPERATIVE IMPLANTS: None  OPERATIVE FINDINGS: Large degenerative medial lateral meniscal tear with large osteochondral defect of the lateral tibial plateau lateral femoral condyle  OPERATIVE PROCEDURE: Patient was brought to the operating room and underwent a general anesthetic. After adequate levels of anesthesia were obtained patient's left lower extremity was prepped using DuraPrep draped into a sterile field. A scope was inserted through the inferior lateral portal and anterior medial working portal was established. Visualization showed a large degenerative tear of the posterior horn of the medial meniscus. This was debrided with the shaver and the biter. This is debrided back to a stable margins. Patient had a mild chondromalacia and mild osteochondral defects of the medial femoral condyle medial tibial plateau and these were debrided with a shaver. Examination the notch showed an intact anterior cruciate ligament. Examination the lateral joint line in the figure 4 position showed a large degenerative lateral meniscal  tear which was debrided with a shaver large osteochondral defects of the lateral femoral condyle lateral and lateral tibial plateau this was also debrided. With the knee extended patient had debridement of the patellofemoral joint as well as debridement of the patella and trochlea of the osteochondral defect. A survey of the compartments was again performed and no loose bodies. The incisions were removed the portals were closed using 2-0 nylon. A sterile compressive dressing was applied. Patient was extubated taken to the PACU in stable condition. Patient did have significant arthritic changes and most likely will require a total knee arthroplasty patient is undergoing gastric bypass surgery and 1 she has lost sufficient weight we will evaluate her for further intervention.

## 2014-06-16 NOTE — Anesthesia Postprocedure Evaluation (Signed)
  Anesthesia Post-op Note  Patient: [name]  Procedure(s) Performed: Procedure(s) with comments: ARTHROSCOPY KNEE (Left) - Left Knee Arthroscopy and Debridement and chrondraplasty  Patient Location: PACU  Anesthesia Type:General  Level of Consciousness: awake, alert  and oriented  Airway and Oxygen Therapy: Patient Spontanous Breathing and Patient connected to nasal cannula oxygen  Post-op Pain: mild  Post-op Assessment: Post-op Vital signs reviewed, Patient's Cardiovascular Status Stable, Respiratory Function Stable, Patent Airway and Pain level controlled  Post-op Vital Signs: stable  Last Vitals:  Filed Vitals:   06/16/14 1515  BP:   Pulse: 49  Temp:   Resp: 16    Complications: No apparent anesthesia complications

## 2014-06-16 NOTE — Anesthesia Preprocedure Evaluation (Signed)
Anesthesia Evaluation  Patient identified by MRN, date of birth, ID band Patient awake    Reviewed: Allergy & Precautions, H&P , NPO status , Patient's Chart, lab work & pertinent test results  Airway Mallampati: II TM Distance: >3 FB Neck ROM: Full    Dental  (+) Teeth Intact, Dental Advisory Given   Pulmonary former smoker,  breath sounds clear to auscultation        Cardiovascular hypertension, Rhythm:Regular Rate:Normal     Neuro/Psych    GI/Hepatic   Endo/Other    Renal/GU      Musculoskeletal   Abdominal (+) + obese,   Peds  Hematology   Anesthesia Other Findings   Reproductive/Obstetrics                           Anesthesia Physical Anesthesia Plan  ASA: III  Anesthesia Plan: General   Post-op Pain Management:    Induction: Intravenous  Airway Management Planned: Oral ETT  Additional Equipment:   Intra-op Plan:   Post-operative Plan:   Informed Consent: I have reviewed the patients History and Physical, chart, labs and discussed the procedure including the risks, benefits and alternatives for the proposed anesthesia with the patient or authorized representative who has indicated his/her understanding and acceptance.   Dental advisory given  Plan Discussed with: CRNA and Anesthesiologist  Anesthesia Plan Comments: (Hypertension Morbid obesity H/O chest pain  Plan GA with oral ETT  Suzanne Broodavid Vaudine [name])        Anesthesia Quick Evaluation

## 2014-06-16 NOTE — Discharge Instructions (Signed)
Ice and elevation left knee. Weightbearing as tolerated. Remove dressing in 2 days. Okay to shower after dressing removed. No restrictions with activities.

## 2014-06-19 ENCOUNTER — Encounter: Payer: PRIVATE HEALTH INSURANCE | Attending: Surgery

## 2014-06-19 ENCOUNTER — Encounter (HOSPITAL_COMMUNITY): Payer: Self-pay | Admitting: Orthopedic Surgery

## 2014-06-19 DIAGNOSIS — I10 Essential (primary) hypertension: Secondary | ICD-10-CM | POA: Insufficient documentation

## 2014-06-19 DIAGNOSIS — J45909 Unspecified asthma, uncomplicated: Secondary | ICD-10-CM | POA: Diagnosis not present

## 2014-06-19 DIAGNOSIS — Z713 Dietary counseling and surveillance: Secondary | ICD-10-CM | POA: Insufficient documentation

## 2014-06-19 DIAGNOSIS — K219 Gastro-esophageal reflux disease without esophagitis: Secondary | ICD-10-CM | POA: Diagnosis not present

## 2014-06-19 DIAGNOSIS — Z9089 Acquired absence of other organs: Secondary | ICD-10-CM | POA: Diagnosis not present

## 2014-06-19 DIAGNOSIS — E039 Hypothyroidism, unspecified: Secondary | ICD-10-CM | POA: Diagnosis not present

## 2014-06-19 DIAGNOSIS — Z6841 Body Mass Index (BMI) 40.0 and over, adult: Secondary | ICD-10-CM | POA: Insufficient documentation

## 2014-06-19 DIAGNOSIS — G4733 Obstructive sleep apnea (adult) (pediatric): Secondary | ICD-10-CM | POA: Diagnosis not present

## 2014-06-19 DIAGNOSIS — E785 Hyperlipidemia, unspecified: Secondary | ICD-10-CM | POA: Diagnosis not present

## 2014-06-19 DIAGNOSIS — Z7982 Long term (current) use of aspirin: Secondary | ICD-10-CM | POA: Diagnosis not present

## 2014-06-19 DIAGNOSIS — I2510 Atherosclerotic heart disease of native coronary artery without angina pectoris: Secondary | ICD-10-CM | POA: Insufficient documentation

## 2014-06-19 DIAGNOSIS — Z9989 Dependence on other enabling machines and devices: Secondary | ICD-10-CM | POA: Insufficient documentation

## 2014-06-19 NOTE — Progress Notes (Signed)
  Pre-Operative Nutrition Class:  Appt start time: 830   End time:  930.  Patient was seen on 06/19/2014 for Pre-Operative Bariatric Surgery Education at the [facility].   Surgery date: 07/04/2014 Surgery type: Sleeve Gastrectomy [facility]: 330 lbs on 06/13/2014 Weight today: 331.5 lbs  TANITA  BODY COMP RESULTS  06/19/14   BMI (kg/m^2) 56.9   Fat Mass (lbs) 197.0   Fat Free Mass (lbs) 134.5   Total Body Water (lbs) 98.5   Samples given per MNT protocol. Patient educated on appropriate usage: Bariatric Advantage Multivitamin Orange Complete Lot # [id number] Exp: 08/2014  BariActive Calcium Citrate +D3 Lot #[id number] Exp: 04/2015  Celebrate B12 Cherry Lot # [id number] Exp: 01/2015  Premier Protein Zonia Kief Lot #[id number] Exp: 03/30/2015  Renee Pain Protein Powder Chocolate Lot # [id number] Exp: 10/206   The following the learning objectives were met by the patient during this course:  Identify Pre-Op Dietary Goals and will begin 2 weeks pre-operatively  Identify appropriate sources of fluids and proteins   State protein recommendations and appropriate sources pre and post-operatively  Identify Post-Operative Dietary Goals and will follow for 2 weeks post-operatively  Identify appropriate multivitamin and calcium sources  Describe the need for physical activity post-operatively and will follow MD recommendations  State when to call healthcare provider regarding medication questions or post-operative complications  Handouts given during class include:  Pre-Op Bariatric Surgery Diet Handout  Protein Shake Handout  Post-Op Bariatric Surgery Nutrition Handout  BELT Program Information Flyer  Support Group Information Flyer  [facility] Bariatric Supplements Price List  Follow-Up Plan: Patient will follow-up at [facility] 2 weeks post operatively for diet advancement per MD.

## 2014-06-19 NOTE — Patient Instructions (Signed)
Follow:   Pre-Op Diet per MD 2 weeks prior to surgery  Phase 2- Liquids (clear/full) 2 weeks after surgery  Vitamin/Mineral/Calcium guidelines for purchasing bariatric supplements  Exercise guidelines pre and post-op per MD  Follow-up at [facility] in 2 weeks post-op for diet advancement.  

## 2014-06-27 NOTE — Progress Notes (Signed)
LOV with EKG Dr Jacinto HalimGanji and clearance 8/15 chart, eccho, stress test 2/15 chart, chest x ray 7/15 epic, LOV Dr Richardean Chimeraohmeir 8/14 and sleep study 5/14 EPIC

## 2014-06-27 NOTE — Patient Instructions (Addendum)
Your procedure is scheduled on:  07/04/14  TUESDAY  Report to [facility]-- MAIN ENTRANCE- FOLLOW SIGNS TO SHORT STAY CENTER Short Stay Center at     11:15  AM.   Call this number if you have problems the morning of surgery: [phone]     BRING CPAP MASK AND TUBING WITH YOU TO HOSPITAL   Do not    EAT ANY FOOD AFTER MIDNIGHT Monday NIGHT--- MAY HAVE CLEAR LIQUIDS Tuesday MORNING UNTIL 07:15 AM-- THEN NOTHING BY MOUTH   Take these medicines the morning of surgery with A SIP OF WATER: ATENOLOL, AMLODIPINE, DEXILANT, LEVOTHYROXINE MAY TAKE NITROGLYCERIN, OXYCODONE/ OXYMORPHONE/ OR  IF NEEDED.  MAY USE FLONASE IF NEEDED   .  Contacts, dentures or partial plates, or metal hairpins  can not be worn to surgery. Your family will be responsible for glasses, dentures, hearing aides while you are in surgery  Leave suitcase in the car. After surgery it may be brought to your room.  For patients admitted to the hospital, checkout time is 11:00 AM day of  discharge.         [facility] IS NOT RESPONSIBLE FOR ANY VALUABLES                                                                                                       [facility] - Preparing for Surgery Before surgery, you can play an important role.  Because skin is not sterile, your skin needs to be as free of germs as possible.  You can reduce the number of germs on your skin by washing with CHG (chlorahexidine gluconate) soap before surgery.  CHG is an antiseptic cleaner which kills germs and bonds with the skin to continue killing germs even after washing. Please DO NOT use if you have an allergy to CHG or antibacterial soaps.  If your skin becomes reddened/irritated stop using the CHG and inform your nurse when you arrive at Short Stay. Do not shave (including legs and underarms) for at least 48 hours prior to the first CHG shower.  You may shave your face/neck. Please follow these instructions carefully:  1.  Shower with CHG  Soap the night before surgery and the  morning of Surgery.  2.  If you choose to wash your hair, wash your hair first as usual with your  normal  shampoo.  3.  After you shampoo, rinse your hair and body thoroughly to remove the  shampoo.                           4.  Use CHG as you would any other liquid soap.  You can apply chg directly  to the skin and wash                       Gently with a scrungie or clean washcloth.  5.  Apply the CHG Soap to your body ONLY FROM THE NECK DOWN.   Do not use on face/  open                           Wound or open sores. Avoid contact with eyes, ears mouth and genitals (private parts).                       Wash face,  Genitals (private parts) with your normal soap.             6.  Wash thoroughly, paying special attention to the area where your surgery  will be performed.  7.  Thoroughly rinse your body with warm water from the neck down.  8.  DO NOT shower/wash with your normal soap after using and rinsing off  the CHG Soap.                9.  Pat yourself dry with a clean towel.            10.  Wear clean pajamas.            11.  Place clean sheets on your bed the night of your first shower and do not  sleep with pets. Day of Surgery : Do not apply any lotions/deodorants the morning of surgery.  Please wear clean clothes to the hospital/surgery center.  FAILURE TO FOLLOW THESE INSTRUCTIONS MAY RESULT IN THE CANCELLATION OF YOUR SURGERY PATIENT SIGNATURE_________________________________  NURSE SIGNATURE__________________________________  ________________________________________________________________________   [name]  An incentive spirometer is a tool that can help keep your lungs clear and active. This tool measures how well you are filling your lungs with each breath. Taking long deep breaths may help reverse or decrease the chance of developing breathing (pulmonary) problems (especially infection) following:  A long period of time  when you are unable to move or be active. BEFORE THE PROCEDURE   If the spirometer includes an indicator to show your best effort, your nurse or respiratory therapist will set it to a desired goal.  If possible, sit up straight or lean slightly forward. Try not to slouch.  Hold the incentive spirometer in an upright position. INSTRUCTIONS FOR USE  1. Sit on the edge of your bed if possible, or sit up as far as you can in bed or on a chair. 2. Hold the incentive spirometer in an upright position. 3. Breathe out normally. 4. Place the mouthpiece in your mouth and seal your lips tightly around it. 5. Breathe in slowly and as deeply as possible, raising the piston or the ball toward the top of the column. 6. Hold your breath for 3-5 seconds or for as long as possible. Allow the piston or ball to fall to the bottom of the column. 7. Remove the mouthpiece from your mouth and breathe out normally. 8. Rest for a few seconds and repeat Steps 1 through 7 at least 10 times every 1-2 hours when you are awake. Take your time and take a few normal breaths between deep breaths. 9. The spirometer may include an indicator to show your best effort. Use the indicator as a goal to work toward during each repetition. 10. After each set of 10 deep breaths, practice coughing to be sure your lungs are clear. If you have an incision (the cut made at the time of surgery), support your incision when coughing by placing a pillow or rolled up towels firmly against it. Once you are able to get out of bed, walk around indoors and  cough well. You may stop using the incentive spirometer when instructed by your caregiver.  RISKS AND COMPLICATIONS  Take your time so you do not get dizzy or light-headed.  If you are in pain, you may need to take or ask for pain medication before doing incentive spirometry. It is harder to take a deep breath if you are having pain. AFTER USE  Rest and breathe slowly and easily.  It can be  helpful to keep track of a log of your progress. Your caregiver can provide you with a simple table to help with this. If you are using the spirometer at home, follow these instructions: SEEK MEDICAL CARE IF:   You are having difficultly using the spirometer.  You have trouble using the spirometer as often as instructed.  Your pain medication is not giving enough relief while using the spirometer.  You develop fever of 100.5 F (38.1 C) or higher. SEEK IMMEDIATE MEDICAL CARE IF:   You cough up bloody sputum that had not been present before.  You develop fever of 102 F (38.9 C) or greater.  You develop worsening pain at or near the incision site. MAKE SURE YOU:   Understand these instructions.  Will watch your condition.  Will get help right away if you are not doing well or get worse. Document Released: 03/16/2007 Document Revised: 01/26/2012 Document Reviewed: 05/17/2007 Exit[employer] Patient Information 2014 [employer], [place].   ________________________________________________________________________    CLEAR LIQUID DIET   Foods Allowed                                                                     Foods Excluded  Coffee and tea, regular and decaf                             liquids that you cannot  Plain Jell-O in any flavor                                             see through such as: Fruit ices (not with fruit pulp)                                     milk, soups, orange juice  Iced Popsicles                                    All solid food Carbonated beverages, regular and diet                                    Cranberry, grape and apple juices Sports drinks like Gatorade Lightly seasoned clear broth or consume(fat free) Sugar, honey syrup  Sample Menu Breakfast                                Lunch  Supper Cranberry juice                    Beef broth                            Chicken broth Jell-O                                      Grape juice                           Apple juice Coffee or tea                        Jell-O                                      Popsicle                                                Coffee or tea                        Coffee or tea  _____________________________________________________________________

## 2014-06-28 ENCOUNTER — Encounter (HOSPITAL_COMMUNITY)
Admission: RE | Admit: 2014-06-28 | Discharge: 2014-06-28 | Disposition: A | Discharge status: Home / Self Care | Payer: PRIVATE HEALTH INSURANCE | Source: Ambulatory Visit | Attending: Surgery | Admitting: Surgery

## 2014-06-28 ENCOUNTER — Encounter (HOSPITAL_COMMUNITY): Payer: Self-pay

## 2014-06-28 DIAGNOSIS — Z01812 Encounter for preprocedural laboratory examination: Secondary | ICD-10-CM | POA: Insufficient documentation

## 2014-06-28 DIAGNOSIS — Z01818 Encounter for other preprocedural examination: Secondary | ICD-10-CM | POA: Diagnosis not present

## 2014-06-28 HISTORY — DX: Gout, unspecified: M10.9

## 2014-06-28 LAB — CBC WITH DIFFERENTIAL/PLATELET
Basophils Absolute: 0 10*3/uL (ref 0.0–0.1)
Basophils Relative: 0 % (ref 0–1)
Eosinophils Absolute: 0.1 10*3/uL (ref 0.0–0.7)
Eosinophils Relative: 1 % (ref 0–5)
HCT: 41.8 % (ref 36.0–46.0)
HEMOGLOBIN: 13.9 g/dL (ref 12.0–15.0)
LYMPHS ABS: 1.9 10*3/uL (ref 0.7–4.0)
LYMPHS PCT: 23 % (ref 12–46)
MCH: 31 pg (ref 26.0–34.0)
MCHC: 33.3 g/dL (ref 30.0–36.0)
MCV: 93.3 fL (ref 78.0–100.0)
MONOS PCT: 14 % — AB (ref 3–12)
Monocytes Absolute: 1.2 10*3/uL — ABNORMAL HIGH (ref 0.1–1.0)
NEUTROS ABS: 5 10*3/uL (ref 1.7–7.7)
Neutrophils Relative %: 62 % (ref 43–77)
PLATELETS: 338 10*3/uL (ref 150–400)
RBC: 4.48 MIL/uL (ref 3.87–5.11)
RDW: 13.4 % (ref 11.5–15.5)
WBC: 8.1 10*3/uL (ref 4.0–10.5)

## 2014-06-28 LAB — COMPREHENSIVE METABOLIC PANEL
ALK PHOS: 96 U/L (ref 39–117)
ALT: 13 U/L (ref 0–35)
ANION GAP: 11 (ref 5–15)
AST: 16 U/L (ref 0–37)
Albumin: 3.8 g/dL (ref 3.5–5.2)
BILIRUBIN TOTAL: 0.7 mg/dL (ref 0.3–1.2)
BUN: 10 mg/dL (ref 6–23)
CHLORIDE: 103 meq/L (ref 96–112)
CO2: 25 mEq/L (ref 19–32)
Calcium: 10.5 mg/dL (ref 8.4–10.5)
Creatinine, Ser: 0.53 mg/dL (ref 0.50–1.10)
GFR calc non Af Amer: >90 mL/min (ref >90)
GLUCOSE: 104 mg/dL — AB (ref 70–99)
Potassium: 4 mEq/L (ref 3.7–5.3)
Sodium: 139 mEq/L (ref 137–147)
Total Protein: 7.6 g/dL (ref 6.0–8.3)

## 2014-06-28 NOTE — Progress Notes (Signed)
eccho 2/15 on chart

## 2014-06-28 NOTE — Progress Notes (Signed)
Notified Amy in portable equipment of need for bari bed

## 2014-06-29 ENCOUNTER — Ambulatory Visit (INDEPENDENT_AMBULATORY_CARE_PROVIDER_SITE_OTHER): Payer: 59 | Admitting: Surgery

## 2014-06-29 ENCOUNTER — Encounter (INDEPENDENT_AMBULATORY_CARE_PROVIDER_SITE_OTHER): Payer: Self-pay | Admitting: Surgery

## 2014-06-29 MED ORDER — HYDROCODONE-ACETAMINOPHEN 7.5-325 MG/15ML PO SOLN: 15.0000 mL | ORAL | Status: AC | PRN

## 2014-06-29 NOTE — Patient Instructions (Signed)

## 2014-06-29 NOTE — Progress Notes (Signed)
Chief Complaint:  Morbid obesity BMI 56  History of Present Illness:  [name] is an 48 y.o. female who saw Dr. Madilyn Hook last May in consultation for a bariatric operation. It is noted that she has had problems with hypertension, hyperlipidemia, hypothyroidism, coronary artery disease, and arthritis in her back and her knees. Currently she is not having any problem with GERD and she had as a result of his workup a negative upper GI series. She was interested in a gastric bypass but upon this revisit said she was considering both a bypass and a sleeve. I discussed both operations with her consent she were lysed on ibuprofen and aspirin I think a sleeve would be a very viable and attractive operation for her. Also she had a large right paramedian incision for a cholecystectomy and I think it is likely that she has significant adhesions H. Might preclude a bypass.  We will move ahead with permeating her for a sleeve gastrectomy. I explained the operation to her and its limitations and potential risk and she wants to go ahead and proceed with a sleeve gastrectomy. She denies any history of DVT. She is seeing Dr. Meridee Score regarding her left knee and had arthroscopic surgery.  She appears to be doing well from that.  Her UGI was negative for an hiatus hernia.  She is ready for sleeve gastrectomy next week.  She has been having trouble finding a suitable protein shake.     Past Medical History  Diagnosis Date  . Hypertension   . Thyroid disease   . Arthritis   . Hyperlipidemia   . Morbidly obese     BMI 55  . Sleep apnea     cpap  . Hypothyroidism   . GERD (gastroesophageal reflux disease)   . Asthma in adult     "seasonal"  . Gout     Past Surgical History  Procedure Laterality Date  . Shoulder Right 2011  . Hand surgery Left 2012  . Hand surgery    . Thyroid surgery  2002-2003  . Cesarean section  [id number]  . Breath tek h pylori N/A 07/19/2013    Procedure: BREATH TEK H PYLORI;   Surgeon: Madilyn Hook, DO;  Location: [facility];  Service: Endoscopy;  Laterality: N/A;  . Knee arthroscopy Left 06/16/2014    Procedure: ARTHROSCOPY KNEE;  Surgeon: Newt Minion, MD;  Location: [facility];  Service: Orthopedics;  Laterality: Left;  Left Knee Arthroscopy and Debridement and chrondraplasty  . Cholecystectomy  1985    Current Outpatient Prescriptions  Medication Sig Dispense Refill  . amLODipine (NORVASC) 10 MG tablet Take 10 mg by mouth every morning.       Marland Kitchen aspirin EC 81 MG tablet Take 81 mg by mouth every morning.      Marland Kitchen atenolol (TENORMIN) 25 MG tablet Take 25 mg by mouth every morning.       . colchicine 0.6 MG tablet Take 0.6 mg by mouth 2 (two) times daily as needed. [place]      . CRESTOR 40 MG tablet Take 40 mg by mouth every morning.       Marland Kitchen DEXILANT 30 MG capsule Take 30 mg by mouth daily.       . fluticasone (FLONASE) 50 MCG/ACT nasal spray Place 1 spray into the nose daily as needed for allergies.       Marland Kitchen levothyroxine (SYNTHROID, LEVOTHROID) 150 MCG tablet Take 150 mcg by mouth daily before breakfast.       .  nitroGLYCERIN (NITROSTAT) 0.4 MG SL tablet Place 0.4 mg under the tongue every 5 (five) minutes as needed for chest pain.      . oxycodone (ROXICODONE) 30 MG immediate release tablet Take 30 mg by mouth every 8 (eight) hours as needed for pain.       . oxyCODONE-acetaminophen (ROXICET) 5-325 MG per tablet Take 1 tablet by mouth every 4 (four) hours as needed for severe pain.  60 tablet  0  . oxymorphone (OPANA ER) 40 MG 12 hr tablet Take 40 mg by mouth 2 (two) times daily as needed for pain.      . PENNSAID 2 % SOLN Apply 1 application topically 2 (two) times daily as needed (pain).       . HYDROcodone-acetaminophen (HYCET) 7.5-325 mg/15 ml solution Take 15 mLs by mouth every 4 (four) hours as needed for moderate pain.  300 mL  0   No current facility-administered medications for this visit.   Ibuprofen; Lyrica; Dilaudid; and Tape Family History  Problem  Relation Age of Onset  . Coronary artery disease Sister   . Diabetes Brother   . Stroke Father   . Hypertension Other    Social History:   reports that she quit smoking about 9 years ago. Her smoking use included Cigarettes. She smoked 0.00 packs per day. She has never used smokeless tobacco. She reports that she does not drink alcohol or use illicit drugs.   REVIEW OF SYSTEMS : Positive for arthritis ; otherwise negative  Physical Exam:   Blood pressure 128/76, pulse 56, resp. rate 16, height 5' 4" (1.626 m), weight 323 lb (146.512 kg), last menstrual period 06/19/2014. Body mass index is 55.42 kg/(m^2).  Gen:  WDWN AAF NAD  Neurological: Alert and oriented to person, place, and time. Motor and sensory function is grossly intact  Head: Normocephalic and atraumatic.  Eyes: Conjunctivae are normal. Pupils are equal, round, and reactive to light. No scleral icterus.  Neck: Normal range of motion. Neck supple. No tracheal deviation or thyromegaly present.  Cardiovascular:  SR without murmurs or gallops.  No carotid bruits Breast:  Not examined Respiratory: Effort normal.  No respiratory distress. No chest wall tenderness. Breath sounds normal.  No wheezes, rales or rhonchi.  Abdomen:  Obese with right paramedian incision and C section incision GU:  Not examined Musculoskeletal: Normal range of motion. Extremities are nontender. No cyanosis, edema or clubbing noted Lymphadenopathy: No cervical, preauricular, postauricular or axillary adenopathy is present Skin: Skin is warm and dry. No rash noted. No diaphoresis. No erythema. No pallor. Pscyh: Normal mood and affect. Behavior is normal. Judgment and thought content normal.   LABORATORY RESULTS: Results for orders placed during the hospital encounter of 06/28/14 (from the past 48 hour(s))  CBC WITH DIFFERENTIAL     Status: Abnormal   Collection Time    06/28/14  8:40 AM      Result Value Ref Range   WBC 8.1  4.0 - 10.5 K/uL   RBC 4.48   3.87 - 5.11 MIL/uL   Hemoglobin 13.9  12.0 - 15.0 g/dL   HCT 41.8  36.0 - 46.0 %   MCV 93.3  78.0 - 100.0 fL   MCH 31.0  26.0 - 34.0 pg   MCHC 33.3  30.0 - 36.0 g/dL   RDW 13.4  11.5 - 15.5 %   Platelets 338  150 - 400 K/uL   Neutrophils Relative % 62  43 - 77 %   Neutro Abs 5.0    1.7 - 7.7 K/uL   Lymphocytes Relative 23  12 - 46 %   Lymphs Abs 1.9  0.7 - 4.0 K/uL   Monocytes Relative 14 (*) 3 - 12 %   Monocytes Absolute 1.2 (*) 0.1 - 1.0 K/uL   Eosinophils Relative 1  0 - 5 %   Eosinophils Absolute 0.1  0.0 - 0.7 K/uL   Basophils Relative 0  0 - 1 %   Basophils Absolute 0.0  0.0 - 0.1 K/uL  COMPREHENSIVE METABOLIC PANEL     Status: Abnormal   Collection Time    06/28/14  8:40 AM      Result Value Ref Range   Sodium 139  137 - 147 mEq/L   Potassium 4.0  3.7 - 5.3 mEq/L   Chloride 103  96 - 112 mEq/L   CO2 25  19 - 32 mEq/L   Glucose, Bld 104 (*) 70 - 99 mg/dL   BUN 10  6 - 23 mg/dL   Creatinine, Ser 0.53  0.50 - 1.10 mg/dL   Calcium 10.5  8.4 - 10.5 mg/dL   Total Protein 7.6  6.0 - 8.3 g/dL   Albumin 3.8  3.5 - 5.2 g/dL   AST 16  0 - 37 U/L   ALT 13  0 - 35 U/L   Alkaline Phosphatase 96  39 - 117 U/L   Total Bilirubin 0.7  0.3 - 1.2 mg/dL   GFR calc non Af Amer >90  >90 mL/min   GFR calc Af Amer >90  >90 mL/min   Comment: (NOTE)     The eGFR has been calculated using the CKD EPI equation.     This calculation has not been validated in all clinical situations.     eGFR's persistently <90 mL/min signify possible Chronic Kidney     Disease.   Anion gap 11  5 - 15     RADIOLOGY RESULTS: No results found.  Problem List: Patient Active Problem List   Diagnosis Date Noted  . Morbidly obese   . Asthma in adult   . Obstructive sleep apnea (adult) (pediatric) 04/08/2013    Assessment & Plan: Morbid obesity with BMI 56;  Plan sleeve gastrectomy next Tuesday.      Matt B. Hassell Done, MD, [facility] [phone]  beeper [phone]  06/29/2014 1:09 PM

## 2014-07-04 ENCOUNTER — Encounter (HOSPITAL_COMMUNITY): Payer: PRIVATE HEALTH INSURANCE | Admitting: Anesthesiology

## 2014-07-04 ENCOUNTER — Inpatient Hospital Stay (HOSPITAL_COMMUNITY): Payer: PRIVATE HEALTH INSURANCE | Admitting: Anesthesiology

## 2014-07-04 ENCOUNTER — Encounter (HOSPITAL_COMMUNITY): Payer: Self-pay | Admitting: *Deleted

## 2014-07-04 ENCOUNTER — Encounter (HOSPITAL_COMMUNITY)
Admission: RE | Disposition: A | Discharge status: Home / Self Care | Payer: Self-pay | Source: Ambulatory Visit | Attending: Surgery

## 2014-07-04 ENCOUNTER — Inpatient Hospital Stay (HOSPITAL_COMMUNITY)
Admission: RE | Admit: 2014-07-04 | Discharge: 2014-07-07 | DRG: 621 | Disposition: A | Discharge status: Home / Self Care | Payer: PRIVATE HEALTH INSURANCE | Source: Ambulatory Visit | Attending: Surgery | Admitting: Surgery

## 2014-07-04 DIAGNOSIS — K219 Gastro-esophageal reflux disease without esophagitis: Secondary | ICD-10-CM | POA: Diagnosis present

## 2014-07-04 DIAGNOSIS — Z8249 Family history of ischemic heart disease and other diseases of the circulatory system: Secondary | ICD-10-CM

## 2014-07-04 DIAGNOSIS — M109 Gout, unspecified: Secondary | ICD-10-CM | POA: Diagnosis present

## 2014-07-04 DIAGNOSIS — G4730 Sleep apnea, unspecified: Secondary | ICD-10-CM | POA: Diagnosis present

## 2014-07-04 DIAGNOSIS — Z7982 Long term (current) use of aspirin: Secondary | ICD-10-CM

## 2014-07-04 DIAGNOSIS — J45909 Unspecified asthma, uncomplicated: Secondary | ICD-10-CM | POA: Diagnosis present

## 2014-07-04 DIAGNOSIS — Z87891 Personal history of nicotine dependence: Secondary | ICD-10-CM | POA: Diagnosis not present

## 2014-07-04 DIAGNOSIS — Z6841 Body Mass Index (BMI) 40.0 and over, adult: Secondary | ICD-10-CM | POA: Diagnosis not present

## 2014-07-04 DIAGNOSIS — E039 Hypothyroidism, unspecified: Secondary | ICD-10-CM | POA: Diagnosis present

## 2014-07-04 DIAGNOSIS — E785 Hyperlipidemia, unspecified: Secondary | ICD-10-CM | POA: Diagnosis present

## 2014-07-04 DIAGNOSIS — Z01812 Encounter for preprocedural laboratory examination: Secondary | ICD-10-CM | POA: Diagnosis not present

## 2014-07-04 DIAGNOSIS — I2510 Atherosclerotic heart disease of native coronary artery without angina pectoris: Secondary | ICD-10-CM | POA: Diagnosis present

## 2014-07-04 DIAGNOSIS — I10 Essential (primary) hypertension: Secondary | ICD-10-CM | POA: Diagnosis present

## 2014-07-04 DIAGNOSIS — Z833 Family history of diabetes mellitus: Secondary | ICD-10-CM

## 2014-07-04 DIAGNOSIS — K449 Diaphragmatic hernia without obstruction or gangrene: Secondary | ICD-10-CM | POA: Diagnosis present

## 2014-07-04 DIAGNOSIS — M129 Arthropathy, unspecified: Secondary | ICD-10-CM | POA: Diagnosis present

## 2014-07-04 DIAGNOSIS — Z823 Family history of stroke: Secondary | ICD-10-CM | POA: Diagnosis not present

## 2014-07-04 DIAGNOSIS — Z79899 Other long term (current) drug therapy: Secondary | ICD-10-CM | POA: Diagnosis not present

## 2014-07-04 DIAGNOSIS — Z9884 Bariatric surgery status: Secondary | ICD-10-CM

## 2014-07-04 HISTORY — PX: LAPAROSCOPIC GASTRIC SLEEVE RESECTION: SHX5895

## 2014-07-04 LAB — CBC
HCT: 43.4 % (ref 36.0–46.0)
Hemoglobin: 14.7 g/dL (ref 12.0–15.0)
MCH: 31.5 pg (ref 26.0–34.0)
MCHC: 33.9 g/dL (ref 30.0–36.0)
MCV: 92.9 fL (ref 78.0–100.0)
Platelets: 291 10*3/uL (ref 150–400)
RBC: 4.67 MIL/uL (ref 3.87–5.11)
RDW: 13.2 % (ref 11.5–15.5)
WBC: 18.3 10*3/uL — ABNORMAL HIGH (ref 4.0–10.5)

## 2014-07-04 LAB — CREATININE, SERUM
CREATININE: 0.47 mg/dL — AB (ref 0.50–1.10)
GFR calc Af Amer: >90 mL/min (ref >90)

## 2014-07-04 LAB — PREGNANCY, URINE: PREG TEST UR: NEGATIVE

## 2014-07-04 SURGERY — GASTRECTOMY, SLEEVE, LAPAROSCOPIC
Anesthesia: General | Site: Abdomen

## 2014-07-04 MED ORDER — ONDANSETRON HCL 4 MG/2ML IJ SOLN
INTRAMUSCULAR | Status: AC
  Filled 2014-07-04: qty 2

## 2014-07-04 MED ORDER — LIP MEDEX EX OINT
TOPICAL_OINTMENT | CUTANEOUS | Status: AC
  Filled 2014-07-04: qty 7

## 2014-07-04 MED ORDER — MORPHINE SULFATE 2 MG/ML IJ SOLN
2.0000 mg | INTRAMUSCULAR | Status: DC | PRN
Start: 2014-07-04 — End: 2014-07-07
  Administered 2014-07-04: 2 mg via INTRAVENOUS
  Administered 2014-07-04 – 2014-07-05 (×2): 4 mg via INTRAVENOUS
  Administered 2014-07-05: 2 mg via INTRAVENOUS
  Administered 2014-07-05 (×2): 4 mg via INTRAVENOUS
  Administered 2014-07-05: 6 mg via INTRAVENOUS
  Administered 2014-07-05: 2 mg via INTRAVENOUS
  Administered 2014-07-05: 6 mg via INTRAVENOUS
  Administered 2014-07-05 – 2014-07-06 (×5): 4 mg via INTRAVENOUS
  Administered 2014-07-06: 6 mg via INTRAVENOUS
  Administered 2014-07-06: 4 mg via INTRAVENOUS
  Administered 2014-07-06: 6 mg via INTRAVENOUS
  Filled 2014-07-04: qty 3
  Filled 2014-07-04: qty 2
  Filled 2014-07-04: qty 3
  Filled 2014-07-04 (×2): qty 2
  Filled 2014-07-04: qty 1
  Filled 2014-07-04 (×2): qty 2
  Filled 2014-07-04: qty 3
  Filled 2014-07-04: qty 2
  Filled 2014-07-04: qty 1
  Filled 2014-07-04: qty 2
  Filled 2014-07-04: qty 3
  Filled 2014-07-04: qty 2
  Filled 2014-07-04: qty 1
  Filled 2014-07-04 (×2): qty 2

## 2014-07-04 MED ORDER — PROPOFOL 10 MG/ML IV BOLUS
INTRAVENOUS | Status: DC | PRN
  Administered 2014-07-04: 200 mg via INTRAVENOUS

## 2014-07-04 MED ORDER — MORPHINE SULFATE 10 MG/ML IJ SOLN
INTRAMUSCULAR | Status: AC
  Filled 2014-07-04: qty 1

## 2014-07-04 MED ORDER — OXYCODONE HCL 5 MG/5ML PO SOLN
5.0000 mg | ORAL | Status: DC | PRN
  Administered 2014-07-05: 5 mg via ORAL
  Administered 2014-07-06 (×2): 10 mg via ORAL
  Administered 2014-07-06: 5 mg via ORAL
  Administered 2014-07-07: 10 mg via ORAL
  Filled 2014-07-04: qty 5
  Filled 2014-07-04 (×3): qty 10
  Filled 2014-07-04: qty 5

## 2014-07-04 MED ORDER — UNJURY CHICKEN SOUP POWDER
2.0000 [oz_av] | Freq: Four times a day (QID) | ORAL | Status: DC
  Administered 2014-07-06 – 2014-07-07 (×5): 2 [oz_av] via ORAL

## 2014-07-04 MED ORDER — PROMETHAZINE HCL 25 MG/ML IJ SOLN
6.2500 mg | INTRAMUSCULAR | Status: DC | PRN
  Administered 2014-07-04: 12.5 mg via INTRAVENOUS

## 2014-07-04 MED ORDER — MORPHINE SULFATE 10 MG/ML IJ SOLN
1.0000 mg | INTRAMUSCULAR | Status: DC | PRN
  Administered 2014-07-04 (×6): 2 mg via INTRAVENOUS

## 2014-07-04 MED ORDER — ONDANSETRON HCL 4 MG/2ML IJ SOLN
4.0000 mg | Freq: Once | INTRAMUSCULAR | Status: AC
  Administered 2014-07-04: 4 mg via INTRAVENOUS

## 2014-07-04 MED ORDER — HYDROMORPHONE HCL PF 1 MG/ML IJ SOLN: 0.2500 mg | INTRAMUSCULAR | Status: DC | PRN

## 2014-07-04 MED ORDER — UNJURY CHOCOLATE CLASSIC POWDER: 2.0000 [oz_av] | Freq: Four times a day (QID) | ORAL | Status: DC

## 2014-07-04 MED ORDER — PHENYLEPHRINE HCL 10 MG/ML IJ SOLN
INTRAMUSCULAR | Status: DC | PRN
  Administered 2014-07-04 (×2): 40 ug via INTRAVENOUS

## 2014-07-04 MED ORDER — BUPIVACAINE LIPOSOME 1.3 % IJ SUSP
20.0000 mL | Freq: Once | INTRAMUSCULAR | Status: AC
  Administered 2014-07-04: 20 mL
  Filled 2014-07-04: qty 20

## 2014-07-04 MED ORDER — PROPOFOL 10 MG/ML IV BOLUS
INTRAVENOUS | Status: AC
  Filled 2014-07-04: qty 20

## 2014-07-04 MED ORDER — ROCURONIUM BROMIDE 100 MG/10ML IV SOLN
INTRAVENOUS | Status: AC
  Filled 2014-07-04: qty 1

## 2014-07-04 MED ORDER — ROCURONIUM BROMIDE 100 MG/10ML IV SOLN
INTRAVENOUS | Status: DC | PRN
  Administered 2014-07-04: 10 mg via INTRAVENOUS
  Administered 2014-07-04: 30 mg via INTRAVENOUS
  Administered 2014-07-04: 10 mg via INTRAVENOUS

## 2014-07-04 MED ORDER — SUCCINYLCHOLINE CHLORIDE 20 MG/ML IJ SOLN
INTRAMUSCULAR | Status: DC | PRN
  Administered 2014-07-04: 140 mg via INTRAVENOUS

## 2014-07-04 MED ORDER — FENTANYL CITRATE 0.05 MG/ML IJ SOLN
INTRAMUSCULAR | Status: AC
  Filled 2014-07-04: qty 5

## 2014-07-04 MED ORDER — MIDAZOLAM HCL 2 MG/2ML IJ SOLN
INTRAMUSCULAR | Status: AC
  Filled 2014-07-04: qty 2

## 2014-07-04 MED ORDER — DEXTROSE 5 % IV SOLN
2.0000 g | Freq: Once | INTRAVENOUS | Status: AC
  Administered 2014-07-04 (×2): 2 g via INTRAVENOUS
  Filled 2014-07-04: qty 2

## 2014-07-04 MED ORDER — ONDANSETRON HCL 4 MG/2ML IJ SOLN
INTRAMUSCULAR | Status: DC | PRN
  Administered 2014-07-04: 4 mg via INTRAVENOUS

## 2014-07-04 MED ORDER — LIDOCAINE HCL (PF) 2 % IJ SOLN
INTRAMUSCULAR | Status: DC | PRN
  Administered 2014-07-04: 100 mg via INTRADERMAL

## 2014-07-04 MED ORDER — ACETAMINOPHEN 160 MG/5ML PO SOLN: 650.0000 mg | ORAL | Status: DC | PRN

## 2014-07-04 MED ORDER — GLYCOPYRROLATE 0.2 MG/ML IJ SOLN
INTRAMUSCULAR | Status: DC | PRN
  Administered 2014-07-04: 0.4 mg via INTRAVENOUS

## 2014-07-04 MED ORDER — ONDANSETRON HCL 4 MG/2ML IJ SOLN
4.0000 mg | INTRAMUSCULAR | Status: DC | PRN
  Administered 2014-07-05 (×2): 4 mg via INTRAVENOUS
  Filled 2014-07-04 (×2): qty 2

## 2014-07-04 MED ORDER — LACTATED RINGERS IR SOLN
Status: DC | PRN
  Administered 2014-07-04: 1000 mL

## 2014-07-04 MED ORDER — NITROGLYCERIN 0.4 MG SL SUBL: 0.4000 mg | SUBLINGUAL_TABLET | SUBLINGUAL | Status: DC | PRN

## 2014-07-04 MED ORDER — ACETAMINOPHEN 10 MG/ML IV SOLN
1000.0000 mg | Freq: Once | INTRAVENOUS | Status: AC
  Administered 2014-07-04: 1000 mg via INTRAVENOUS
  Filled 2014-07-04: qty 100

## 2014-07-04 MED ORDER — SODIUM CHLORIDE 0.9 % IJ SOLN
INTRAMUSCULAR | Status: AC
  Filled 2014-07-04: qty 10

## 2014-07-04 MED ORDER — PHENYLEPHRINE 40 MCG/ML (10ML) SYRINGE FOR IV PUSH (FOR BLOOD PRESSURE SUPPORT)
PREFILLED_SYRINGE | INTRAVENOUS | Status: AC
  Filled 2014-07-04: qty 10

## 2014-07-04 MED ORDER — ACETAMINOPHEN 160 MG/5ML PO SOLN
325.0000 mg | ORAL | Status: DC | PRN
  Administered 2014-07-05: 325 mg via ORAL

## 2014-07-04 MED ORDER — LIDOCAINE HCL (CARDIAC) 20 MG/ML IV SOLN
INTRAVENOUS | Status: AC
  Filled 2014-07-04: qty 5

## 2014-07-04 MED ORDER — HEPARIN SODIUM (PORCINE) 5000 UNIT/ML IJ SOLN
5000.0000 [IU] | Freq: Three times a day (TID) | INTRAMUSCULAR | Status: DC
  Administered 2014-07-05 – 2014-07-07 (×7): 5000 [IU] via SUBCUTANEOUS
  Filled 2014-07-04 (×10): qty 1

## 2014-07-04 MED ORDER — FENTANYL CITRATE 0.05 MG/ML IJ SOLN
INTRAMUSCULAR | Status: DC | PRN
  Administered 2014-07-04 (×3): 50 ug via INTRAVENOUS
  Administered 2014-07-04: 100 ug via INTRAVENOUS

## 2014-07-04 MED ORDER — 0.9 % SODIUM CHLORIDE (POUR BTL) OPTIME
TOPICAL | Status: DC | PRN
  Administered 2014-07-04: 1000 mL

## 2014-07-04 MED ORDER — PROMETHAZINE HCL 25 MG/ML IJ SOLN
INTRAMUSCULAR | Status: AC
  Filled 2014-07-04: qty 1

## 2014-07-04 MED ORDER — GLYCOPYRROLATE 0.2 MG/ML IJ SOLN
INTRAMUSCULAR | Status: AC
  Filled 2014-07-04: qty 2

## 2014-07-04 MED ORDER — MIDAZOLAM HCL 5 MG/5ML IJ SOLN
INTRAMUSCULAR | Status: DC | PRN
  Administered 2014-07-04: 2 mg via INTRAVENOUS

## 2014-07-04 MED ORDER — KCL IN DEXTROSE-NACL 20-5-0.45 MEQ/L-%-% IV SOLN
INTRAVENOUS | Status: DC
  Administered 2014-07-04: 21:00:00 via INTRAVENOUS
  Administered 2014-07-05: 100 mL/h via INTRAVENOUS
  Administered 2014-07-06: 12:00:00 via INTRAVENOUS
  Administered 2014-07-06 – 2014-07-07 (×3): 100 mL via INTRAVENOUS
  Filled 2014-07-04 (×8): qty 1000

## 2014-07-04 MED ORDER — DEXAMETHASONE SODIUM PHOSPHATE 10 MG/ML IJ SOLN
INTRAMUSCULAR | Status: DC | PRN
  Administered 2014-07-04: 10 mg via INTRAVENOUS

## 2014-07-04 MED ORDER — NEOSTIGMINE METHYLSULFATE 10 MG/10ML IV SOLN
INTRAVENOUS | Status: DC | PRN
  Administered 2014-07-04: 3.5 mg via INTRAVENOUS

## 2014-07-04 MED ORDER — LACTATED RINGERS IV SOLN
INTRAVENOUS | Status: DC
  Administered 2014-07-04: 1000 mL via INTRAVENOUS
  Administered 2014-07-04: 17:00:00 via INTRAVENOUS

## 2014-07-04 MED ORDER — DEXAMETHASONE SODIUM PHOSPHATE 10 MG/ML IJ SOLN
INTRAMUSCULAR | Status: AC
  Filled 2014-07-04: qty 1

## 2014-07-04 MED ORDER — UNJURY VANILLA POWDER: 2.0000 [oz_av] | Freq: Four times a day (QID) | ORAL | Status: DC

## 2014-07-04 MED ORDER — DEXTROSE 5 % IV SOLN
INTRAVENOUS | Status: AC
  Filled 2014-07-04: qty 2

## 2014-07-04 SURGICAL SUPPLY — 58 items
APPLICATOR COTTON TIP 6IN STRL (MISCELLANEOUS) IMPLANT
APPLIER CLIP ROT 10 11.4 M/L (STAPLE)
APPLIER CLIP ROT 13.4 12 LRG (CLIP) ×2
BLADE HEX COATED 2.75 (ELECTRODE) ×2 IMPLANT
BLADE SURG 15 STRL LF DISP TIS (BLADE) ×1 IMPLANT
BLADE SURG 15 STRL SS (BLADE) ×1
CABLE HIGH FREQUENCY MONO STRZ (ELECTRODE) ×2 IMPLANT
CLIP APPLIE ROT 10 11.4 M/L (STAPLE) IMPLANT
CLIP APPLIE ROT 13.4 12 LRG (CLIP) ×1 IMPLANT
DERMABOND ADVANCED (GAUZE/BANDAGES/DRESSINGS) ×1
DERMABOND ADVANCED .7 DNX12 (GAUZE/BANDAGES/DRESSINGS) ×1 IMPLANT
DEVICE SUT QUICK LOAD TK 5 (STAPLE) ×4 IMPLANT
DEVICE SUT TI-KNOT TK 5X26 (MISCELLANEOUS) ×2 IMPLANT
DEVICE SUTURE ENDOST 10MM (ENDOMECHANICALS) IMPLANT
DEVICE TROCAR PUNCTURE CLOSURE (ENDOMECHANICALS) ×2 IMPLANT
DISSECTOR BLUNT TIP ENDO 5MM (MISCELLANEOUS) ×4 IMPLANT
DRAPE CAMERA CLOSED 9X96 (DRAPES) ×2 IMPLANT
ELECT REM PT RETURN 9FT ADLT (ELECTROSURGICAL) ×2
ELECTRODE REM PT RTRN 9FT ADLT (ELECTROSURGICAL) ×1 IMPLANT
GAUZE SPONGE 4X4 12PLY STRL (GAUZE/BANDAGES/DRESSINGS) IMPLANT
GLOVE BIOGEL M 8.0 STRL (GLOVE) ×2 IMPLANT
GOWN STRL REUS W/TWL XL LVL3 (GOWN DISPOSABLE) ×6 IMPLANT
HANDLE STAPLE EGIA 4 XL (STAPLE) ×2 IMPLANT
HOVERMATT SINGLE USE (MISCELLANEOUS) ×2 IMPLANT
KIT BASIN OR (CUSTOM PROCEDURE TRAY) ×2 IMPLANT
NEEDLE SPNL 22GX3.5 QUINCKE BK (NEEDLE) ×2 IMPLANT
PACK UNIVERSAL I (CUSTOM PROCEDURE TRAY) ×2 IMPLANT
PEN SKIN MARKING BROAD (MISCELLANEOUS) ×2 IMPLANT
RELOAD ENDO STITCH (ENDOMECHANICALS) ×4 IMPLANT
RELOAD TRI 45 ART MED THCK BLK (STAPLE) ×2 IMPLANT
RELOAD TRI 45 ART MED THCK PUR (STAPLE) ×2 IMPLANT
RELOAD TRI 60 ART MED THCK BLK (STAPLE) ×2 IMPLANT
RELOAD TRI 60 ART MED THCK PUR (STAPLE) ×8 IMPLANT
SCISSORS LAP 5X45 EPIX DISP (ENDOMECHANICALS) ×2 IMPLANT
SCRUB PCMX 4 OZ (MISCELLANEOUS) ×4 IMPLANT
SEALANT SURGICAL APPL DUAL CAN (MISCELLANEOUS) IMPLANT
SET IRRIG TUBING LAPAROSCOPIC (IRRIGATION / IRRIGATOR) ×2 IMPLANT
SHEARS CURVED HARMONIC AC 45CM (MISCELLANEOUS) ×2 IMPLANT
SLEEVE ADV FIXATION 5X100MM (TROCAR) ×4 IMPLANT
SLEEVE GASTRECTOMY 36FR VISIGI (MISCELLANEOUS) ×2 IMPLANT
SOLUTION ANTI FOG 6CC (MISCELLANEOUS) ×2 IMPLANT
SPONGE LAP 18X18 X RAY DECT (DISPOSABLE) ×2 IMPLANT
STAPLER VISISTAT 35W (STAPLE) ×2 IMPLANT
SUT VIC AB 4-0 SH 18 (SUTURE) ×2 IMPLANT
SYRINGE 20CC LL (MISCELLANEOUS) ×2 IMPLANT
SYRINGE 60CC LL (MISCELLANEOUS) ×2 IMPLANT
TOWEL OR 17X26 10 PK STRL BLUE (TOWEL DISPOSABLE) ×4 IMPLANT
TOWEL OR NON WOVEN STRL DISP B (DISPOSABLE) ×2 IMPLANT
TRAY FOLEY CATH 14FRSI W/METER (CATHETERS) ×2 IMPLANT
TROCAR ADV FIXATION 12X100MM (TROCAR) ×2 IMPLANT
TROCAR ADV FIXATION 5X100MM (TROCAR) ×2 IMPLANT
TROCAR BLADELESS 15MM (ENDOMECHANICALS) ×2 IMPLANT
TROCAR BLADELESS OPT 5 100 (ENDOMECHANICALS) ×2 IMPLANT
TROCAR XCEL NON-BLD 11X100MML (ENDOMECHANICALS) ×2 IMPLANT
TUBE CALIBRATION LAPBAND (TUBING) IMPLANT
TUBING CONNECTING 10 (TUBING) ×2 IMPLANT
TUBING ENDO SMARTCAP (MISCELLANEOUS) ×2 IMPLANT
TUBING FILTER THERMOFLATOR (ELECTROSURGICAL) ×2 IMPLANT

## 2014-07-04 NOTE — Anesthesia Preprocedure Evaluation (Addendum)
Anesthesia Evaluation  Patient identified by MRN, date of birth, ID band Patient awake    Reviewed: Allergy & Precautions, H&P , NPO status , Patient's Chart, lab work & pertinent test results, reviewed documented beta blocker date and time   Airway Mallampati: III TM Distance: <3 FB Neck ROM: Full    Dental no notable dental hx.    Pulmonary asthma , sleep apnea , former smoker,  breath sounds clear to auscultation  + decreased breath sounds      Cardiovascular hypertension, Pt. on medications and Pt. on home beta blockers Rhythm:Regular Rate:Normal     Neuro/Psych negative neurological ROS  negative psych ROS   GI/Hepatic Neg liver ROS, GERD-  ,  Endo/Other  Hypothyroidism Morbid obesity  Renal/GU negative Renal ROS  negative genitourinary   Musculoskeletal negative musculoskeletal ROS (+)   Abdominal (+) + obese,   Peds negative pediatric ROS (+)  Hematology negative hematology ROS (+)   Anesthesia Other Findings   Reproductive/Obstetrics negative OB ROS                          Anesthesia Physical Anesthesia Plan  ASA: III  Anesthesia Plan: General   Post-op Pain Management:    Induction: Intravenous  Airway Management Planned: Oral ETT  Additional Equipment:   Intra-op Plan:   Post-operative Plan: Extubation in OR  Informed Consent: I have reviewed the patients History and Physical, chart, labs and discussed the procedure including the risks, benefits and alternatives for the proposed anesthesia with the patient or authorized representative who has indicated his/her understanding and acceptance.   Dental advisory given  Plan Discussed with: CRNA  Anesthesia Plan Comments:        Anesthesia Quick Evaluation

## 2014-07-04 NOTE — Anesthesia Postprocedure Evaluation (Signed)
Anesthesia Post Note  Patient: Suzanne CorrenteSarah W [name]  Procedure(s) Performed: Procedure(s) (LRB): LAPAROSCOPIC GASTRIC SLEEVE RESECTION (N/A)  Anesthesia type: General  Patient location: PACU  Post pain: Pain level controlled  Post assessment: Post-op Vital signs reviewed  Last Vitals: BP 135/86  Pulse 76  Temp(Src) 37.1 C (Oral)  Resp 23  Ht 5\' 4"  (1.626 m)  Wt 323 lb (146.512 kg)  BMI 55.42 kg/m2  SpO2 99%  LMP 06/17/2014  Post vital signs: Reviewed  Level of consciousness: sedated  Complications: No apparent anesthesia complications

## 2014-07-04 NOTE — H&P (View-Only) (Signed)
Chief Complaint:  Morbid obesity BMI 56  History of Present Illness:  [name] is an 48 y.o. female who saw Dr. Madilyn Hook last May in consultation for a bariatric operation. It is noted that she has had problems with hypertension, hyperlipidemia, hypothyroidism, coronary artery disease, and arthritis in her back and her knees. Currently she is not having any problem with GERD and she had as a result of his workup a negative upper GI series. She was interested in a gastric bypass but upon this revisit said she was considering both a bypass and a sleeve. I discussed both operations with her consent she were lysed on ibuprofen and aspirin I think a sleeve would be a very viable and attractive operation for her. Also she had a large right paramedian incision for a cholecystectomy and I think it is likely that she has significant adhesions H. Might preclude a bypass.  We will move ahead with permeating her for a sleeve gastrectomy. I explained the operation to her and its limitations and potential risk and she wants to go ahead and proceed with a sleeve gastrectomy. She denies any history of DVT. She is seeing Dr. Meridee Score regarding her left knee and had arthroscopic surgery.  She appears to be doing well from that.  Her UGI was negative for an hiatus hernia.  She is ready for sleeve gastrectomy next week.  She has been having trouble finding a suitable protein shake.     Past Medical History  Diagnosis Date  . Hypertension   . Thyroid disease   . Arthritis   . Hyperlipidemia   . Morbidly obese     BMI 55  . Sleep apnea     cpap  . Hypothyroidism   . GERD (gastroesophageal reflux disease)   . Asthma in adult     "seasonal"  . Gout     Past Surgical History  Procedure Laterality Date  . Shoulder Right 2011  . Hand surgery Left 2012  . Hand surgery    . Thyroid surgery  2002-2003  . Cesarean section  [id number]  . Breath tek h pylori N/A 07/19/2013    Procedure: BREATH TEK H PYLORI;   Surgeon: Madilyn Hook, DO;  Location: [facility];  Service: Endoscopy;  Laterality: N/A;  . Knee arthroscopy Left 06/16/2014    Procedure: ARTHROSCOPY KNEE;  Surgeon: Newt Minion, MD;  Location: [facility];  Service: Orthopedics;  Laterality: Left;  Left Knee Arthroscopy and Debridement and chrondraplasty  . Cholecystectomy  1985    Current Outpatient Prescriptions  Medication Sig Dispense Refill  . amLODipine (NORVASC) 10 MG tablet Take 10 mg by mouth every morning.       Marland Kitchen aspirin EC 81 MG tablet Take 81 mg by mouth every morning.      Marland Kitchen atenolol (TENORMIN) 25 MG tablet Take 25 mg by mouth every morning.       . colchicine 0.6 MG tablet Take 0.6 mg by mouth 2 (two) times daily as needed. [place]      . CRESTOR 40 MG tablet Take 40 mg by mouth every morning.       Marland Kitchen DEXILANT 30 MG capsule Take 30 mg by mouth daily.       . fluticasone (FLONASE) 50 MCG/ACT nasal spray Place 1 spray into the nose daily as needed for allergies.       Marland Kitchen levothyroxine (SYNTHROID, LEVOTHROID) 150 MCG tablet Take 150 mcg by mouth daily before breakfast.       .  nitroGLYCERIN (NITROSTAT) 0.4 MG SL tablet Place 0.4 mg under the tongue every 5 (five) minutes as needed for chest pain.      Marland Kitchen oxycodone (ROXICODONE) 30 MG immediate release tablet Take 30 mg by mouth every 8 (eight) hours as needed for pain.       Marland Kitchen oxyCODONE-acetaminophen (ROXICET) 5-325 MG per tablet Take 1 tablet by mouth every 4 (four) hours as needed for severe pain.  60 tablet  0  . oxymorphone (OPANA ER) 40 MG 12 hr tablet Take 40 mg by mouth 2 (two) times daily as needed for pain.      Marland Kitchen PENNSAID 2 % SOLN Apply 1 application topically 2 (two) times daily as needed (pain).       Marland Kitchen HYDROcodone-acetaminophen (HYCET) 7.5-325 mg/15 ml solution Take 15 mLs by mouth every 4 (four) hours as needed for moderate pain.  300 mL  0   No current facility-administered medications for this visit.   Ibuprofen; Lyrica; Dilaudid; and Tape Family History  Problem  Relation Age of Onset  . Coronary artery disease Sister   . Diabetes Brother   . Stroke Father   . Hypertension Other    Social History:   reports that she quit smoking about 9 years ago. Her smoking use included Cigarettes. She smoked 0.00 packs per day. She has never used smokeless tobacco. She reports that she does not drink alcohol or use illicit drugs.   REVIEW OF SYSTEMS : Positive for arthritis ; otherwise negative  Physical Exam:   Blood pressure 128/76, pulse 56, resp. rate 16, height _0  (1.626 m), weight 323 lb (146.512 kg), last menstrual period 06/19/2014. Body mass index is 55.42 kg/(m^2).  Gen:  WDWN AAF NAD  Neurological: Alert and oriented to person, place, and time. Motor and sensory function is grossly intact  Head: Normocephalic and atraumatic.  Eyes: Conjunctivae are normal. Pupils are equal, round, and reactive to light. No scleral icterus.  Neck: Normal range of motion. Neck supple. No tracheal deviation or thyromegaly present.  Cardiovascular:  SR without murmurs or gallops.  No carotid bruits Breast:  Not examined Respiratory: Effort normal.  No respiratory distress. No chest wall tenderness. Breath sounds normal.  No wheezes, rales or rhonchi.  Abdomen:  Obese with right paramedian incision and C section incision GU:  Not examined Musculoskeletal: Normal range of motion. Extremities are nontender. No cyanosis, edema or clubbing noted Lymphadenopathy: No cervical, preauricular, postauricular or axillary adenopathy is present Skin: Skin is warm and dry. No rash noted. No diaphoresis. No erythema. No pallor. Pscyh: Normal mood and affect. Behavior is normal. Judgment and thought content normal.   LABORATORY RESULTS: Results for orders placed during the hospital encounter of 06/28/14 (from the past 48 hour(s))  CBC WITH DIFFERENTIAL     Status: Abnormal   Collection Time    06/28/14  8:40 AM      Result Value Ref Range   WBC 8.1  4.0 - 10.5 K/uL   RBC 4.48   3.87 - 5.11 MIL/uL   Hemoglobin 13.9  12.0 - 15.0 g/dL   HCT 41.8  36.0 - 46.0 %   MCV 93.3  78.0 - 100.0 fL   MCH 31.0  26.0 - 34.0 pg   MCHC 33.3  30.0 - 36.0 g/dL   RDW 13.4  11.5 - 15.5 %   Platelets 338  150 - 400 K/uL   Neutrophils Relative % 62  43 - 77 %   Neutro Abs 5.0  1.7 - 7.7 K/uL   Lymphocytes Relative 23  12 - 46 %   Lymphs Abs 1.9  0.7 - 4.0 K/uL   Monocytes Relative 14 (*) 3 - 12 %   Monocytes Absolute 1.2 (*) 0.1 - 1.0 K/uL   Eosinophils Relative 1  0 - 5 %   Eosinophils Absolute 0.1  0.0 - 0.7 K/uL   Basophils Relative 0  0 - 1 %   Basophils Absolute 0.0  0.0 - 0.1 K/uL  COMPREHENSIVE METABOLIC PANEL     Status: Abnormal   Collection Time    06/28/14  8:40 AM      Result Value Ref Range   Sodium 139  137 - 147 mEq/L   Potassium 4.0  3.7 - 5.3 mEq/L   Chloride 103  96 - 112 mEq/L   CO2 25  19 - 32 mEq/L   Glucose, Bld 104 (*) 70 - 99 mg/dL   BUN 10  6 - 23 mg/dL   Creatinine, Ser 0.53  0.50 - 1.10 mg/dL   Calcium 10.5  8.4 - 10.5 mg/dL   Total Protein 7.6  6.0 - 8.3 g/dL   Albumin 3.8  3.5 - 5.2 g/dL   AST 16  0 - 37 U/L   ALT 13  0 - 35 U/L   Alkaline Phosphatase 96  39 - 117 U/L   Total Bilirubin 0.7  0.3 - 1.2 mg/dL   GFR calc non Af Amer >90  >90 mL/min   GFR calc Af Amer >90  >90 mL/min   Comment: (NOTE)     The eGFR has been calculated using the CKD EPI equation.     This calculation has not been validated in all clinical situations.     eGFR's persistently <90 mL/min signify possible Chronic Kidney     Disease.   Anion gap 11  5 - 15     RADIOLOGY RESULTS: No results found.  Problem List: Patient Active Problem List   Diagnosis Date Noted  . Morbidly obese   . Asthma in adult   . Obstructive sleep apnea (adult) (pediatric) 04/08/2013    Assessment & Plan: Morbid obesity with BMI 56;  Plan sleeve gastrectomy next Tuesday.      Matt B. Hassell Done, MD, [facility] [phone]  beeper [phone]  06/29/2014 1:09 PM

## 2014-07-04 NOTE — Interval H&P Note (Signed)
History and Physical Interval Note:  07/04/2014 1:40 PM  Suzanne CorrenteSarah W Sliwa  has presented today for surgery, with the diagnosis of Morbid Obesity  The various methods of treatment have been discussed with the patient and family. After consideration of risks, benefits and other options for treatment, the patient has consented to  Procedure(s): LAPAROSCOPIC GASTRIC SLEEVE RESECTION (N/A) as a surgical intervention .  The patient's history has been reviewed, patient examined, no change in status, stable for surgery.  I have reviewed the patient's chart and labs.  Questions were answered to the patient's satisfaction.     Arel Tippen B

## 2014-07-04 NOTE — Transfer of Care (Signed)
Immediate Anesthesia Transfer of Care Note  Patient: [name]  Procedure(s) Performed: Procedure(s) (LRB): LAPAROSCOPIC GASTRIC SLEEVE RESECTION (N/A)  Patient Location: PACU  Anesthesia Type: General  Level of Consciousness: sedated, patient cooperative and responds to stimulation  Airway & Oxygen Therapy: Patient Spontanous Breathing and Patient connected to face mask oxgen  Post-op Assessment: Report given to PACU RN and Post -op Vital signs reviewed and stable  Post vital signs: Reviewed and stable  Complications: No apparent anesthesia complicationsImmediate Anesthesia Transfer of Care Note  Patient: [name]  Procedure(s) Performed: Procedure(s): LAPAROSCOPIC GASTRIC SLEEVE RESECTION (N/A)  Patient Location: PACU  Anesthesia Type:General  Level of Consciousness: awake, alert , oriented and patient cooperative  Airway & Oxygen Therapy: Patient Spontanous Breathing and Patient connected to face mask oxygen  Post-op Assessment: Report given to PACU RN, Post -op Vital signs reviewed and stable and Patient moving all extremities X 4  Post vital signs: Reviewed and stable  Complications: No apparent anesthesia complications

## 2014-07-04 NOTE — Op Note (Signed)
Surgeon: Wenda LowMatt Keiarra Charon, MD, FACS  Asst:  Jaclynn GuarneriBen Hoxworth, MD, FACS  Anes:  General endotracheal  Procedure: Laparoscopic sleeve gastrectomy and repair of hiatus hernia, upper endoscopy per Dr. Johna SheriffHoxworth  Diagnosis: Morbid obesity  Complications: none  EBL:   8 cc  Description of Procedure:  The patient was take to OR 1 and given general anesthesia.  The abdomen was prepped with PCMX and draped sterilely.  A timeout was performed.  Access to the abdomen was achieved with 5 mm Optiview through a left upper quadrant.  Following insufflation, the state of the abdomen was found to be with adhesions from her right paramedian incision gallbladder surgery..  The ViSiGi 36Fr tube was inserted to deflate the stomach and was pulled back into the esophagus.  The calibration tube was inserted and with 10 cc air bolus in the balloon,  a hiatus hernia was felt to be present.  The anatomy was distored by the cava which was prominent and the foregut was shielded by enlargement of the left lateral segment and the gastrohepatic ligament.  The vessel in the ligament was clipped and divided and this enabled the posterior hiatus to be dissected and a single suture with the Endostitch to be placed and the balloon test was repeated and was negative.    The pylorus was identified and we measured 5  cm back and marked the antrum.  At that point we began dissection to take down the greater curvature of the stomach using the Harmonic scalpel.  This dissection was taken all the way up to the left crus.  Posterior attachments of the stomach were also taken down.    The ViSiGi tube was then passed into the antrum and suction applied so that it was snug along the lessor curvature.  The "crow's foot" or incisura was identified.  The sleeve gastrectomy was begun using the [employer] platform stapler beginning with a 4.5 mm black load.  This was followed by a 6 cm black load and then multiple purple loads, all with the TSR  buttressing.  When the sleeve was complete the tube was taken off suction and insufflated briefly.  The tube was withdrawn.  Upper endoscopy was then performed by Dr. Johna SheriffHoxworth which showed no bleeding and no bubbles.     The specimen was extracted through the 15 trocar site.  Wounds were infiltrated with Exparel and closed with 4-0 vicryl.  Susy Frizzle.    Matt B. Daphine DeutscherMartin, MD, [facility], [place] [phone]

## 2014-07-05 ENCOUNTER — Inpatient Hospital Stay (HOSPITAL_COMMUNITY): Payer: PRIVATE HEALTH INSURANCE

## 2014-07-05 ENCOUNTER — Encounter (HOSPITAL_COMMUNITY): Payer: Self-pay | Admitting: Surgery

## 2014-07-05 DIAGNOSIS — Z09 Encounter for follow-up examination after completed treatment for conditions other than malignant neoplasm: Secondary | ICD-10-CM

## 2014-07-05 LAB — CBC WITH DIFFERENTIAL/PLATELET
BASOS PCT: 0 % (ref 0–1)
Basophils Absolute: 0 10*3/uL (ref 0.0–0.1)
EOS PCT: 0 % (ref 0–5)
Eosinophils Absolute: 0 10*3/uL (ref 0.0–0.7)
HEMATOCRIT: 41.9 % (ref 36.0–46.0)
HEMOGLOBIN: 14.3 g/dL (ref 12.0–15.0)
Lymphocytes Relative: 7 % — ABNORMAL LOW (ref 12–46)
Lymphs Abs: 0.9 10*3/uL (ref 0.7–4.0)
MCH: 31.2 pg (ref 26.0–34.0)
MCHC: 34.1 g/dL (ref 30.0–36.0)
MCV: 91.5 fL (ref 78.0–100.0)
MONO ABS: 0.9 10*3/uL (ref 0.1–1.0)
MONOS PCT: 7 % (ref 3–12)
Neutro Abs: 12.2 10*3/uL — ABNORMAL HIGH (ref 1.7–7.7)
Neutrophils Relative %: 86 % — ABNORMAL HIGH (ref 43–77)
Platelets: 315 10*3/uL (ref 150–400)
RBC: 4.58 MIL/uL (ref 3.87–5.11)
RDW: 12.9 % (ref 11.5–15.5)
WBC: 14 10*3/uL — ABNORMAL HIGH (ref 4.0–10.5)

## 2014-07-05 LAB — HEMOGLOBIN AND HEMATOCRIT, BLOOD
HCT: 42.3 % (ref 36.0–46.0)
Hemoglobin: 14.4 g/dL (ref 12.0–15.0)

## 2014-07-05 MED ORDER — IOHEXOL 300 MG/ML  SOLN
50.0000 mL | Freq: Once | INTRAMUSCULAR | Status: AC | PRN
  Administered 2014-07-05: 50 mL via ORAL

## 2014-07-05 NOTE — Progress Notes (Signed)
Patient alert and oriented, Post op day 1.  Provided support and encouragement.  Encouraged pulmonary toilet, ambulation and small sips of liquids when swallow study completed satisfactorily.  All questions answered.  Will continue to monitor. 

## 2014-07-05 NOTE — Progress Notes (Signed)
Patient ID: [name], female   DOB: 1966/06/13, 48 y.o.   MRN: [id number] [facility] Progress Note:   1 Day Post-Op  Subjective: Mental status is clear.   Objective: Vital signs in last 24 hours: Temp:  [97.4 F (36.3 C)-99.2 F (37.3 C)] 98.9 F (37.2 C) (08/19 1351) Pulse Rate:  [57-76] 58 (08/19 1351) Resp:  [22] 22 (08/19 1351) BP: (127-157)/(59-87) 154/87 mmHg (08/19 1351) SpO2:  [98 %-100 %] 98 % (08/19 1351)  Intake/Output from previous day: 08/18 0701 - 08/19 0700 In: 2495 [I.V.:2395; IV Piggyback:100] Out: 2000 [Urine:2000] Intake/Output this shift: Total I/O In: 860 [P.O.:60; I.V.:800] Out: 1050 [Urine:1050]  Physical Exam: Work of breathing is normal;  Incisions ok.    Lab Results:  Results for orders placed during the hospital encounter of 07/04/14 (from the past 48 hour(s))  PREGNANCY, URINE     Status: None   Collection Time    07/04/14 12:38 PM      Result Value Ref Range   Preg Test, Ur NEGATIVE  NEGATIVE   Comment:            THE SENSITIVITY OF THIS     METHODOLOGY IS >20 mIU/mL.  CBC     Status: Abnormal   Collection Time    07/04/14  7:44 PM      Result Value Ref Range   WBC 18.3 (*) 4.0 - 10.5 K/uL   RBC 4.67  3.87 - 5.11 MIL/uL   Hemoglobin 14.7  12.0 - 15.0 g/dL   HCT 43.4  36.0 - 46.0 %   MCV 92.9  78.0 - 100.0 fL   MCH 31.5  26.0 - 34.0 pg   MCHC 33.9  30.0 - 36.0 g/dL   RDW 13.2  11.5 - 15.5 %   Platelets 291  150 - 400 K/uL  CREATININE, SERUM     Status: Abnormal   Collection Time    07/04/14  7:44 PM      Result Value Ref Range   Creatinine, Ser 0.47 (*) 0.50 - 1.10 mg/dL   GFR calc non Af Amer >90  >90 mL/min   GFR calc Af Amer >90  >90 mL/min   Comment: (NOTE)     The eGFR has been calculated using the CKD EPI equation.     This calculation has not been validated in all clinical situations.     eGFR's persistently <90 mL/min signify possible Chronic Kidney     Disease.  CBC WITH DIFFERENTIAL     Status:  Abnormal   Collection Time    07/05/14  5:09 AM      Result Value Ref Range   WBC 14.0 (*) 4.0 - 10.5 K/uL   RBC 4.58  3.87 - 5.11 MIL/uL   Hemoglobin 14.3  12.0 - 15.0 g/dL   HCT 41.9  36.0 - 46.0 %   MCV 91.5  78.0 - 100.0 fL   MCH 31.2  26.0 - 34.0 pg   MCHC 34.1  30.0 - 36.0 g/dL   RDW 12.9  11.5 - 15.5 %   Platelets 315  150 - 400 K/uL   Neutrophils Relative % 86 (*) 43 - 77 %   Neutro Abs 12.2 (*) 1.7 - 7.7 K/uL   Lymphocytes Relative 7 (*) 12 - 46 %   Lymphs Abs 0.9  0.7 - 4.0 K/uL   Monocytes Relative 7  3 - 12 %   Monocytes Absolute 0.9  0.1 - 1.0 K/uL  Eosinophils Relative 0  0 - 5 %   Eosinophils Absolute 0.0  0.0 - 0.7 K/uL   Basophils Relative 0  0 - 1 %   Basophils Absolute 0.0  0.0 - 0.1 K/uL  HEMOGLOBIN AND HEMATOCRIT, BLOOD     Status: None   Collection Time    07/05/14  3:55 PM      Result Value Ref Range   Hemoglobin 14.4  12.0 - 15.0 g/dL   HCT 42.3  36.0 - 46.0 %    Radiology/Results: Dg Ugi W/water Sol Cm  07/05/2014   CLINICAL DATA:  Postop gastric sleeve procedure.  EXAM: WATER SOLUBLE UPPER GI SERIES  TECHNIQUE: Single-column upper GI series was performed using water soluble contrast.  CONTRAST:  45m OMNIPAQUE IOHEXOL 300 MG/ML  SOLN  COMPARISON:  None.  FLUOROSCOPY TIME:  0 min 23 seconds.  FINDINGS: Scout view of the abdomen shows postoperative changes in the left abdomen. Mild gaseous distention of bowel.  Patient drank 50 cc of water-soluble contrast. Postoperative changes of gastric sleeve procedure. Contrast flowed readily into the duodenum. No leak.  IMPRESSION: Postoperative changes of gastric sleeve procedure without complicating feature.   Electronically Signed   By: MLorin PicketM.D.   On: 07/05/2014 12:38    Anti-infectives: Anti-infectives   Start     Dose/Rate Route Frequency Ordered Stop   07/04/14 1400  cefOXitin (MEFOXIN) 2 g in dextrose 5 % 50 mL IVPB     2 g 100 mL/hr over 30 Minutes Intravenous  Once 07/04/14 1353 07/04/14  1620      Assessment/Plan: Problem List: Patient Active Problem List   Diagnosis Date Noted  . Status post laparoscopic sleeve gastrectomyAugust 2015 07/04/2014  . Morbid obesity 07/04/2014  . Morbidly obese   . Asthma in adult   . Obstructive sleep apnea (adult) (pediatric) 04/08/2013    UGI ok.  Will begin PD 1 diet 1 Day Post-Op    LOS: 1 day   Matt B. MHassell Done MD, [facility] [phone] [phone] 07/05/2014 6:39 PM

## 2014-07-05 NOTE — Care Management Note (Signed)
    Page 1 of 1   07/05/2014     11:00:34 AM CARE MANAGEMENT NOTE 07/05/2014  Patient:  [name],[name]   Account Number:  [id number]  Date Initiated:  07/05/2014  Documentation initiated by:  Lorenda IshiharaPEELE,Jcion Buddenhagen  Subjective/Objective Assessment:   48 yo female admitted s/p sleeve gastrectomy. PTA lived at home.     Action/Plan:   Home when stable   Anticipated DC Date:  07/07/2014   Anticipated DC Plan:  HOME/SELF CARE      DC Planning Services  CM consult      Choice offered to / List presented to:             Status of service:  Completed, signed off Medicare Important Message given?   (If response is "NO", the following Medicare IM given date fields will be blank) Date Medicare IM given:   Medicare IM given by:   Date Additional Medicare IM given:   Additional Medicare IM given by:    Discharge Disposition:  HOME/SELF CARE  Per UR Regulation:  Reviewed for med. necessity/level of care/duration of stay  If discussed at Long Length of Stay Meetings, dates discussed:    Comments:

## 2014-07-05 NOTE — Progress Notes (Signed)
VASCULAR LAB PRELIMINARY  PRELIMINARY  PRELIMINARY  PRELIMINARY  Bilateral lower extremity venous duplex  completed.    Preliminary report:  Bilateral:  No evidence of DVT, superficial thrombosis, or Baker's Cyst.    Nobuo Nunziata, RVT 07/05/2014, 10:33 AM

## 2014-07-05 NOTE — Progress Notes (Signed)
RT set up Auto CPAP 5-20 CMH20 with Pt's home nasal mask and tubing.  Pt will self administer when ready.  Pt informed to cal RT if any complications should arise.  RT to monitor and assess as needed.

## 2014-07-06 LAB — CBC WITH DIFFERENTIAL/PLATELET
BASOS ABS: 0 10*3/uL (ref 0.0–0.1)
BASOS PCT: 0 % (ref 0–1)
EOS ABS: 0 10*3/uL (ref 0.0–0.7)
Eosinophils Relative: 0 % (ref 0–5)
HCT: 45.4 % (ref 36.0–46.0)
Hemoglobin: 15.2 g/dL — ABNORMAL HIGH (ref 12.0–15.0)
Lymphocytes Relative: 15 % (ref 12–46)
Lymphs Abs: 2.2 10*3/uL (ref 0.7–4.0)
MCH: 31.4 pg (ref 26.0–34.0)
MCHC: 33.5 g/dL (ref 30.0–36.0)
MCV: 93.8 fL (ref 78.0–100.0)
Monocytes Absolute: 1.7 10*3/uL — ABNORMAL HIGH (ref 0.1–1.0)
Monocytes Relative: 11 % (ref 3–12)
NEUTROS PCT: 74 % (ref 43–77)
Neutro Abs: 10.9 10*3/uL — ABNORMAL HIGH (ref 1.7–7.7)
Platelets: 305 10*3/uL (ref 150–400)
RBC: 4.84 MIL/uL (ref 3.87–5.11)
RDW: 13.3 % (ref 11.5–15.5)
WBC: 14.8 10*3/uL — ABNORMAL HIGH (ref 4.0–10.5)

## 2014-07-06 MED ORDER — HYDROCODONE-ACETAMINOPHEN 7.5-325 MG/15ML PO SOLN
15.0000 mL | ORAL | Status: DC | PRN
  Administered 2014-07-06 – 2014-07-07 (×6): 15 mL via ORAL
  Filled 2014-07-06 (×6): qty 15

## 2014-07-06 MED ORDER — CETYLPYRIDINIUM CHLORIDE 0.05 % MT LIQD
7.0000 mL | Freq: Two times a day (BID) | OROMUCOSAL | Status: DC
  Administered 2014-07-07: 7 mL via OROMUCOSAL

## 2014-07-06 MED ORDER — CHLORHEXIDINE GLUCONATE 0.12 % MT SOLN
15.0000 mL | Freq: Two times a day (BID) | OROMUCOSAL | Status: DC
  Administered 2014-07-06 – 2014-07-07 (×2): 15 mL via OROMUCOSAL
  Filled 2014-07-06 (×4): qty 15

## 2014-07-06 NOTE — Progress Notes (Signed)
Patient ID: [name], female   DOB: 1965-12-31, 48 y.o.   MRN: [id number]  [facility] Progress Note:   2 Days Post-Op  Subjective: Mental status is clear;  Nausea and moving slowly Objective: Vital signs in last 24 hours: Temp:  [98.1 F (36.7 C)-99.6 F (37.6 C)] 98.6 F (37 C) (08/20 1400) Pulse Rate:  [52-60] 54 (08/20 1400) Resp:  [18-21] 18 (08/20 1400) BP: (124-160)/(55-101) 130/60 mmHg (08/20 1400) SpO2:  [96 %-100 %] 99 % (08/20 1400) Weight:  [315 lb 12.8 oz (143.246 kg)-318 lb 2 oz (144.3 kg)] 315 lb 12.8 oz (143.246 kg) (08/20 1300)  Intake/Output from previous day: 08/19 0701 - 08/20 0700 In: 2520 [P.O.:60; I.V.:2400] Out: 2450 [Urine:2450] Intake/Output this shift: Total I/O In: 400 [I.V.:400] Out: 1300 [Urine:1300]  Physical Exam: Work of breathing is not labored  Lab Results:  Results for orders placed during the hospital encounter of 07/04/14 (from the past 48 hour(s))  CBC     Status: Abnormal   Collection Time    07/04/14  7:44 PM      Result Value Ref Range   WBC 18.3 (*) 4.0 - 10.5 K/uL   RBC 4.67  3.87 - 5.11 MIL/uL   Hemoglobin 14.7  12.0 - 15.0 g/dL   HCT 43.4  36.0 - 46.0 %   MCV 92.9  78.0 - 100.0 fL   MCH 31.5  26.0 - 34.0 pg   MCHC 33.9  30.0 - 36.0 g/dL   RDW 13.2  11.5 - 15.5 %   Platelets 291  150 - 400 K/uL  CREATININE, SERUM     Status: Abnormal   Collection Time    07/04/14  7:44 PM      Result Value Ref Range   Creatinine, Ser 0.47 (*) 0.50 - 1.10 mg/dL   GFR calc non Af Amer >90  >90 mL/min   GFR calc Af Amer >90  >90 mL/min   Comment: (NOTE)     The eGFR has been calculated using the CKD EPI equation.     This calculation has not been validated in all clinical situations.     eGFR's persistently <90 mL/min signify possible Chronic Kidney     Disease.  CBC WITH DIFFERENTIAL     Status: Abnormal   Collection Time    07/05/14  5:09 AM      Result Value Ref Range   WBC 14.0 (*) 4.0 - 10.5 K/uL   RBC 4.58   3.87 - 5.11 MIL/uL   Hemoglobin 14.3  12.0 - 15.0 g/dL   HCT 41.9  36.0 - 46.0 %   MCV 91.5  78.0 - 100.0 fL   MCH 31.2  26.0 - 34.0 pg   MCHC 34.1  30.0 - 36.0 g/dL   RDW 12.9  11.5 - 15.5 %   Platelets 315  150 - 400 K/uL   Neutrophils Relative % 86 (*) 43 - 77 %   Neutro Abs 12.2 (*) 1.7 - 7.7 K/uL   Lymphocytes Relative 7 (*) 12 - 46 %   Lymphs Abs 0.9  0.7 - 4.0 K/uL   Monocytes Relative 7  3 - 12 %   Monocytes Absolute 0.9  0.1 - 1.0 K/uL   Eosinophils Relative 0  0 - 5 %   Eosinophils Absolute 0.0  0.0 - 0.7 K/uL   Basophils Relative 0  0 - 1 %   Basophils Absolute 0.0  0.0 - 0.1 K/uL  HEMOGLOBIN AND HEMATOCRIT,  BLOOD     Status: None   Collection Time    07/05/14  3:55 PM      Result Value Ref Range   Hemoglobin 14.4  12.0 - 15.0 g/dL   HCT 42.3  36.0 - 46.0 %  CBC WITH DIFFERENTIAL     Status: Abnormal   Collection Time    07/06/14  4:56 AM      Result Value Ref Range   WBC 14.8 (*) 4.0 - 10.5 K/uL   RBC 4.84  3.87 - 5.11 MIL/uL   Hemoglobin 15.2 (*) 12.0 - 15.0 g/dL   HCT 45.4  36.0 - 46.0 %   MCV 93.8  78.0 - 100.0 fL   MCH 31.4  26.0 - 34.0 pg   MCHC 33.5  30.0 - 36.0 g/dL   RDW 13.3  11.5 - 15.5 %   Platelets 305  150 - 400 K/uL   Neutrophils Relative % 74  43 - 77 %   Neutro Abs 10.9 (*) 1.7 - 7.7 K/uL   Lymphocytes Relative 15  12 - 46 %   Lymphs Abs 2.2  0.7 - 4.0 K/uL   Monocytes Relative 11  3 - 12 %   Monocytes Absolute 1.7 (*) 0.1 - 1.0 K/uL   Eosinophils Relative 0  0 - 5 %   Eosinophils Absolute 0.0  0.0 - 0.7 K/uL   Basophils Relative 0  0 - 1 %   Basophils Absolute 0.0  0.0 - 0.1 K/uL    Radiology/Results: Dg Ugi W/water Sol Cm  07/05/2014   CLINICAL DATA:  Postop gastric sleeve procedure.  EXAM: WATER SOLUBLE UPPER GI SERIES  TECHNIQUE: Single-column upper GI series was performed using water soluble contrast.  CONTRAST:  35m OMNIPAQUE IOHEXOL 300 MG/ML  SOLN  COMPARISON:  None.  FLUOROSCOPY TIME:  0 min 23 seconds.  FINDINGS: Scout view of  the abdomen shows postoperative changes in the left abdomen. Mild gaseous distention of bowel.  Patient drank 50 cc of water-soluble contrast. Postoperative changes of gastric sleeve procedure. Contrast flowed readily into the duodenum. No leak.  IMPRESSION: Postoperative changes of gastric sleeve procedure without complicating feature.   Electronically Signed   By: MLorin PicketM.D.   On: 07/05/2014 12:38    Anti-infectives: Anti-infectives   Start     Dose/Rate Route Frequency Ordered Stop   07/04/14 1400  cefOXitin (MEFOXIN) 2 g in dextrose 5 % 50 mL IVPB     2 g 100 mL/hr over 30 Minutes Intravenous  Once 07/04/14 1353 07/04/14 1620      Assessment/Plan: Problem List: Patient Active Problem List   Diagnosis Date Noted  . Status post laparoscopic sleeve gastrectomyAugust 2015 07/04/2014  . Morbid obesity 07/04/2014  . Morbidly obese   . Asthma in adult   . Obstructive sleep apnea (adult) (pediatric) 04/08/2013    Sore after surgery.  Had hiatus hernia repair.  Will check labs in am and hopeful discharge then.  2 Days Post-Op    LOS: 2 days   Matt B. MHassell Done MD, [facility]incoln [facility] [phone] [phone] 07/06/2014 4:05 PM

## 2014-07-06 NOTE — Progress Notes (Signed)
RT set CPAP on auto titrate 5-20cmH2O. Sterile water was added for humidification. Pt is using her home nasal mask and tubing. Pt prefers to place herself on CPAP when ready. Pt was encouraged to contact RT for assistance if needed. RT will continue to monitor as needed.

## 2014-07-06 NOTE — Progress Notes (Signed)
Nutrition Education Note  Received consult for diet education per DROP protocol.   Discussed 2 week post op diet with pt. Emphasized that liquids must be non carbonated, non caffeinated, and sugar free. Fluid goals discussed. Pt to follow up with outpatient bariatric RD for further diet progression after 2 weeks. Multivitamins and minerals also reviewed. Teach back method used, pt expressed understanding, expect good compliance. Pt was spitting up yesterday and today. Had questions about bariatric vitamins/minerals at [facility] - worked with staff there to answer pt's questions and provided her with detailed product/price information.    Diet: First 2 Weeks  You will see the nutritionist about two (2) weeks after your surgery. The nutritionist will increase the types of foods you can eat if you are handling liquids well:  If you have severe vomiting or nausea and cannot handle clear liquids lasting longer than 1 day, call your surgeon  Protein Shake  Drink at least 2 ounces of shake 5-6 times per day  Each serving of protein shakes (usually 8 - 12 ounces) should have a minimum of:  15 grams of protein  And no more than 5 grams of carbohydrate  Goal for protein each day:  Men = 80 grams per day  Women = 60 grams per day  Protein powder may be added to fluids such as non-fat milk or Lactaid milk or Soy milk (limit to 35 grams added protein powder per serving)   Hydration  Slowly increase the amount of water and other clear liquids as tolerated (See Acceptable Fluids)  Slowly increase the amount of protein shake as tolerated  Sip fluids slowly and throughout the day  May use sugar substitutes in small amounts (no more than 6 - 8 packets per day; i.e. Splenda)   Fluid Goal  The first goal is to drink at least 8 ounces of protein shake/drink per day (or as directed by the nutritionist); some examples of protein shakes are [employer]Syntrax Nectar, Dillard'sdkins Advantage, EAS Edge HP, and Unjury.  See handout from pre-op Bariatric Education Class:  Slowly increase the amount of protein shake you drink as tolerated  You may find it easier to slowly sip shakes throughout the day  It is important to get your proteins in first  Your fluid goal is to drink 64 - 100 ounces of fluid daily  It may take a few weeks to build up to this  32 oz (or more) should be clear liquids  And  32 oz (or more) should be full liquids (see below for examples)  Liquids should not contain sugar, caffeine, or carbonation   Clear Liquids:  Water or Sugar-free flavored water (i.e. Fruit H2O, Propel)  Decaffeinated coffee or tea (sugar-free)  Crystal Lite, Wyler's Lite, Minute Maid Lite  Sugar-free Jell-O  Bouillon or broth  Sugar-free Popsicle: *Less than 20 calories each; Limit 1 per day   Full Liquids:  Protein Shakes/Drinks + 2 choices per day of other full liquids  Full liquids must be:  No More Than 12 grams of Carbs per serving  No More Than 3 grams of Fat per serving  Strained low-fat cream soup  Non-Fat milk  Fat-free Lactaid Milk  Sugar-free yogurt (Dannon Lite & Fit, Greek yogurt)     BloomburgHeather Abe Schools MS, RD, [place] [phone] Pager [phone] Weekend/After Hours Pager

## 2014-07-07 LAB — COMPREHENSIVE METABOLIC PANEL
ALBUMIN: 3.3 g/dL — AB (ref 3.5–5.2)
ALT: 333 U/L — AB (ref 0–35)
AST: 132 U/L — ABNORMAL HIGH (ref 0–37)
Alkaline Phosphatase: 100 U/L (ref 39–117)
Anion gap: 11 (ref 5–15)
BUN: 6 mg/dL (ref 6–23)
CALCIUM: 10.3 mg/dL (ref 8.4–10.5)
CO2: 24 mEq/L (ref 19–32)
Chloride: 101 mEq/L (ref 96–112)
Creatinine, Ser: 0.54 mg/dL (ref 0.50–1.10)
GFR calc Af Amer: >90 mL/min (ref >90)
GFR calc non Af Amer: >90 mL/min (ref >90)
Glucose, Bld: 110 mg/dL — ABNORMAL HIGH (ref 70–99)
POTASSIUM: 4.2 meq/L (ref 3.7–5.3)
SODIUM: 136 meq/L — AB (ref 137–147)
TOTAL PROTEIN: 7.2 g/dL (ref 6.0–8.3)
Total Bilirubin: 0.8 mg/dL (ref 0.3–1.2)

## 2014-07-07 LAB — CBC WITH DIFFERENTIAL/PLATELET
BASOS PCT: 0 % (ref 0–1)
Basophils Absolute: 0 10*3/uL (ref 0.0–0.1)
EOS ABS: 0.1 10*3/uL (ref 0.0–0.7)
Eosinophils Relative: 1 % (ref 0–5)
HEMATOCRIT: 43.1 % (ref 36.0–46.0)
HEMOGLOBIN: 13.9 g/dL (ref 12.0–15.0)
LYMPHS ABS: 2.9 10*3/uL (ref 0.7–4.0)
Lymphocytes Relative: 29 % (ref 12–46)
MCH: 30.6 pg (ref 26.0–34.0)
MCHC: 32.3 g/dL (ref 30.0–36.0)
MCV: 94.9 fL (ref 78.0–100.0)
MONO ABS: 1.6 10*3/uL — AB (ref 0.1–1.0)
Monocytes Relative: 15 % — ABNORMAL HIGH (ref 3–12)
NEUTROS ABS: 5.6 10*3/uL (ref 1.7–7.7)
Neutrophils Relative %: 55 % (ref 43–77)
Platelets: 279 10*3/uL (ref 150–400)
RBC: 4.54 MIL/uL (ref 3.87–5.11)
RDW: 13.6 % (ref 11.5–15.5)
WBC: 10.2 10*3/uL (ref 4.0–10.5)

## 2014-07-07 NOTE — Discharge Summary (Signed)
Physician Discharge Summary  Patient ID: [name] MRN: [id number] DOB/AGE: 50-Jun-1967 50 y.o.  Admit date: 07/04/2014 Discharge date: 07/07/2014  Admission Diagnoses:  Morbid obesity  Discharge Diagnoses:  Same with hiatus hernia  Active Problems:   Status post laparoscopic sleeve gastrectomyAugust 2015   Morbid obesity   Surgery:  Lap sleeve gastrectomy and repair of hiatus hernia   Discharged Condition: improved  Hospital Course:   Had surgery.  PD 1 had UGI which looked ok.  Unable to adequately hydrate until PD 3 when she was ready for discharge  Consults: none  Significant Diagnostic Studies: UGI    Discharge Exam: Blood pressure 134/81, pulse 52, temperature 98.4 F (36.9 C), temperature source Oral, resp. rate 18, height 5\' 4"  (1.626 m), weight 316 lb (143.337 kg), last menstrual period 06/17/2014, SpO2 98.00%. Incisions OK.   Disposition: 01-Home or Self Care  Discharge Instructions   Increase activity slowly    Complete by:  As directed             Medication List         amLODipine 10 MG tablet  Commonly known as:  NORVASC  Take 10 mg by mouth every morning.     aspirin EC 81 MG tablet  Take 81 mg by mouth every morning.     atenolol 25 MG tablet  Commonly known as:  TENORMIN  Take 25 mg by mouth every morning.     colchicine 0.6 MG tablet  Take 0.6 mg by mouth 2 (two) times daily as needed. [place]     CRESTOR 40 MG tablet  Generic drug:  rosuvastatin  Take 40 mg by mouth every morning.     DEXILANT 30 MG capsule  Generic drug:  Dexlansoprazole  Take 30 mg by mouth daily.     fluticasone 50 MCG/ACT nasal spray  Commonly known as:  FLONASE  Place 1 spray into the nose daily as needed for allergies.     HYDROcodone-acetaminophen 7.5-325 mg/15 ml solution  Commonly known as:  HYCET  Take 15 mLs by mouth every 4 (four) hours as needed for moderate pain.     levothyroxine 150 MCG tablet  Commonly known as:  SYNTHROID, LEVOTHROID   Take 150 mcg by mouth daily before breakfast.     nitroGLYCERIN 0.4 MG SL tablet  Commonly known as:  NITROSTAT  Place 0.4 mg under the tongue every 5 (five) minutes as needed for chest pain.     oxycodone 30 MG immediate release tablet  Commonly known as:  ROXICODONE  Take 30 mg by mouth every 8 (eight) hours as needed for pain.     oxyCODONE-acetaminophen 5-325 MG per tablet  Commonly known as:  ROXICET  Take 1 tablet by mouth every 4 (four) hours as needed for severe pain.     oxymorphone 40 MG 12 hr tablet  Commonly known as:  OPANA ER  Take 40 mg by mouth 2 (two) times daily as needed for pain.     PENNSAID 2 % Soln  Generic drug:  Diclofenac Sodium  Apply 1 application topically 2 (two) times daily as needed (pain).           Follow-up Information   Follow up with Valarie MerinoMARTIN,Raeford Brandenburg B, MD.   Specialty:  General Surgery   Contact information:   [address] [place] [address] [phone]       Signed: Valarie MerinoMARTIN,Heavenleigh Petruzzi B 07/07/2014, 11:52 AM

## 2014-07-07 NOTE — Progress Notes (Signed)
Pt DC instructions reviewed and all questions and concerns were addressed. Pt is a&ox4, VSS, medicated with pain meds before DC so that she would be comfortable in transfer home, all other skin intact with exception of surgical sites that are all unremarkable. Pt in apparent distress or discomfort at this time. Tolerating her POD 3 bariatric very well. Pt waiting on her ride. Will continue to monitor until left the unit.

## 2014-07-07 NOTE — Discharge Instructions (Signed)

## 2014-07-13 ENCOUNTER — Telehealth (HOSPITAL_COMMUNITY): Payer: Self-pay

## 2014-07-13 NOTE — Telephone Encounter (Signed)
Made discharge phone call to patient per DROP protocol. Asking the following questions.    1. Do you have someone to care for you now that you are home?  y 2. Are you having pain now that is not relieved by your pain medication?  y 3. Are you able to drink the recommended daily amount of fluids (48 ounces minimum/day) and protein (60-80 grams/day) as prescribed by the dietitian or nutritional counselor?  y 4. Are you taking the vitamins and minerals as prescribed?  y 5. Do you have the "on call" number to contact your surgeon if you have a problem or question?  y 6. Are your incisions free of redness, swelling or drainage? (If steri strips, address that these can fall off, shower as tolerated) y 7. Have your bowels moved since your surgery?  If not, are you passing gas?  y 8. Are you up and walking 3-4 times per day?  y    1. Do you have an appointment made to see your surgeon in the next month?  y 2. Were you provided your discharge medications before your surgery or before you were discharged from the hospital and are you taking them without problem?  y 3. Were you provided phone numbers to the clinic/surgeon's office?  y 4. Did you watch the patient education video module in the (clinic, surgeon's office, etc.) before your surgery? y 5. Do you have a discharge checklist that was provided to you in the hospital to reference with instructions on how to take care of yourself after surgery?  y 6. Did you see a dietitian or nutritional counselor while you were in the hospital?  y 7. Do you have an appointment to see a dietitian or nutritional counselor in the next month?  y   

## 2014-07-18 ENCOUNTER — Ambulatory Visit: Payer: Self-pay

## 2014-07-19 ENCOUNTER — Ambulatory Visit (INDEPENDENT_AMBULATORY_CARE_PROVIDER_SITE_OTHER): Payer: 59 | Admitting: Surgery

## 2014-07-20 ENCOUNTER — Encounter: Payer: PRIVATE HEALTH INSURANCE | Attending: Surgery | Admitting: Dietician

## 2014-07-20 DIAGNOSIS — Z713 Dietary counseling and surveillance: Secondary | ICD-10-CM | POA: Diagnosis not present

## 2014-07-20 DIAGNOSIS — Z6841 Body Mass Index (BMI) 40.0 and over, adult: Secondary | ICD-10-CM | POA: Insufficient documentation

## 2014-07-20 NOTE — Patient Instructions (Signed)
Goals:  Follow Phase 3A: High Protein   Eat 3-6 small meals/snacks, every 3-5 hrs  Increase lean protein foods to meet 60g goal  Increase fluid intake to 64oz +  Avoid drinking 15 minutes before, during and 30 minutes after eating  Aim for >30 min of physical activity daily

## 2014-07-20 NOTE — Progress Notes (Signed)
  Follow-up visit:  2 Weeks Post-Operative Sleeve Gastrectomy Surgery  Medical Nutrition Therapy:  Appt start time: 1115 end time:  1200.  Primary concerns today: Post-operative Bariatric Surgery Nutrition Management. Having trouble taking vitamin and wishes that she got vitamins. Having trouble with the shakes but still having just liquids.  Surgery date: 07/04/2014 Surgery type: Sleeve Gastrectomy Start weight at [facility]: 330 lbs on 06/13/2014 Weight today: 316.5 lbs  Weight change: 15 lbs  TANITA  BODY COMP RESULTS  06/19/14 07/20/14   BMI (kg/m^2) 56.9 54.3   Fat Mass (lbs) 197.0 178.0   Fat Free Mass (lbs) 134.5 138.5   Total Body Water (lbs) 98.5 101.5    Preferred Learning Style:   No preference indicated   Learning Readiness:   Ready  24-hr recall: B (AM): Unjury chicken soup (21 g) Snk (AM): bone  L (PM): none Snk (PM): Unjury chicken soup (21 g)  D (PM): none Snk (PM): none  Fluid intake: 6 oz decaf coffee, 16 oz Unjury, 8 oz Wyler's Lemonade, sips on water   Estimated total protein intake: 42 g  Medications: see list  Supplementation: taking  Using straws: No Drinking while eating: No Hair loss: No  Carbonated beverages: No N/V/D/C: was throwing up after surgery in the hospital  Dumping syndrome: No  Recent physical activity:  Using 5 lbs dumbells, walking outside (just started today)  Progress Towards Goal(s):  In progress.  Handouts given during visit include:  Phase 3A High Protein  Supplements given during visit include:  2 Unflavored Unjury, lot #[id number], exp: 09/2015   Nutritional Diagnosis:  [place]-3.3 Overweight/obesity related to past poor dietary habits and physical inactivity as evidenced by patient w/ recent Sleeve Gastrectomy surgery following dietary guidelines for continued weight loss.    Intervention:  Nutrition education/diet advancement  Teaching Method Utilized:  Visual Auditory Hands on  Barriers to learning/adherence to  lifestyle change: none  Demonstrated degree of understanding via:  Teach Back   Monitoring/Evaluation:  Dietary intake, exercise, lap band fills, and body weight. Follow up in 6 weeks for 8 week post-op visit.

## 2014-08-08 ENCOUNTER — Ambulatory Visit: Payer: PRIVATE HEALTH INSURANCE | Attending: Orthopedic Surgery | Admitting: Physical Therapy

## 2014-08-08 DIAGNOSIS — M25569 Pain in unspecified knee: Secondary | ICD-10-CM | POA: Insufficient documentation

## 2014-08-08 DIAGNOSIS — IMO0001 Reserved for inherently not codable concepts without codable children: Secondary | ICD-10-CM | POA: Insufficient documentation

## 2014-08-10 ENCOUNTER — Ambulatory Visit: Payer: PRIVATE HEALTH INSURANCE | Admitting: Physical Therapy

## 2014-08-10 DIAGNOSIS — IMO0001 Reserved for inherently not codable concepts without codable children: Secondary | ICD-10-CM | POA: Diagnosis not present

## 2014-08-22 ENCOUNTER — Encounter: Payer: Self-pay | Admitting: Physical Therapy

## 2014-08-24 ENCOUNTER — Ambulatory Visit: Payer: PRIVATE HEALTH INSURANCE | Attending: Orthopedic Surgery | Admitting: Physical Therapy

## 2014-08-24 DIAGNOSIS — Z5189 Encounter for other specified aftercare: Secondary | ICD-10-CM | POA: Diagnosis present

## 2014-08-24 DIAGNOSIS — M25562 Pain in left knee: Secondary | ICD-10-CM | POA: Diagnosis not present

## 2014-08-29 ENCOUNTER — Encounter: Payer: PRIVATE HEALTH INSURANCE | Attending: Surgery | Admitting: Dietician

## 2014-08-29 ENCOUNTER — Ambulatory Visit: Payer: PRIVATE HEALTH INSURANCE | Admitting: Physical Therapy

## 2014-08-29 DIAGNOSIS — Z5189 Encounter for other specified aftercare: Secondary | ICD-10-CM | POA: Diagnosis not present

## 2014-08-29 DIAGNOSIS — Z6841 Body Mass Index (BMI) 40.0 and over, adult: Secondary | ICD-10-CM | POA: Insufficient documentation

## 2014-08-29 DIAGNOSIS — Z713 Dietary counseling and surveillance: Secondary | ICD-10-CM | POA: Diagnosis not present

## 2014-08-29 NOTE — Progress Notes (Signed)
  Follow-up visit:  8 Weeks Post-Operative Sleeve Gastrectomy Surgery  Medical Nutrition Therapy:  Appt start time: 1030 end time:  1100.  Primary concerns today: Post-operative Bariatric Surgery Nutrition Management. Having knee pain and now using a walker. Having some days where she is struggling to eat and other days where it is "easy". Not having hungry days. No issues tolerating foods overall. Having some burning in her stomach sometimes at night. Takes an acid reflux pill (though not sure which one she takes). Recommended contacting her surgeon's office for advice.   Not meeting protein goals. Recommended adding the Unjury Chicken Soup back in diet.   Surgery date: 07/04/2014 Surgery type: Sleeve Gastrectomy [facility]: 330 lbs on 06/13/2014 Weight today: 291.0 lbs  Weight change: 25.5 lbs Total weight loss: 39 lbs   TANITA  BODY COMP RESULTS  06/19/14 07/20/14 08/29/14   BMI (kg/m^2) 56.9 54.3 50.0   Fat Mass (lbs) 197.0 178.0 163.0   Fat Free Mass (lbs) 134.5 138.5 128.0   Total Body Water (lbs) 98.5 101.5 93.5    Preferred Learning Style:   No preference indicated   Learning Readiness:   Ready  24-hr recall: B (AM): 1-2 eggs with cheese (10-15 g) Snk (AM): popsicle maybe  L (PM): 2-3 oz boiled chicken with string beans (14-21 g) Snk (PM): more chicken and string beans (sometimes) (7 g) D (PM): none Snk (PM): none  Fluid intake: 6 oz decaf coffee, 8 oz Wyler's Lemonade, 51-68 oz sips on water   Estimated total protein intake: ~31 g  Medications: see list  Supplementation: taking  Using straws: No Drinking while eating: No Hair loss: No  Carbonated beverages: No N/V/D/C: once in a while will have nausea and vomiting, having constipation and taking Miralax Dumping syndrome: No  Recent physical activity:  Using 5 lbs dumbells, going to physical therapy 2 x week, thinking about calling Belt progam  Progress Towards Goal(s):  In progress.  Handouts  given during visit include:  Phase 3B High Protein + Non Starchy Vegetables   Nutritional Diagnosis:  [place]-3.3 Overweight/obesity related to past poor dietary habits and physical inactivity as evidenced by patient w/ recent Sleeve Gastrectomy surgery following dietary guidelines for continued weight loss.    Intervention:  Nutrition education/diet reinforcement  Teaching Method Utilized:  Visual Auditory Hands on  Barriers to learning/adherence to lifestyle change: knee pain and stress  Demonstrated degree of understanding via:  Teach Back   Monitoring/Evaluation:  Dietary intake, exercise, and body weight. Follow up in 4 weeks for 12 week post-op visit.

## 2014-08-29 NOTE — Patient Instructions (Addendum)
Goals:  Follow Phase 3B: High Protein + Non Starchy Vegetables  Eat 3-6 small meals/snacks, every 3-5 hrs  Increase lean protein foods to meet 60g goal  Add 1-2 Unjury servings back   Increase fluid intake to 64oz +  Avoid drinking 15 minutes before, during and 30 minutes after eating  Aim for >30 min of physical activity daily (Contact [facility] program)  Contact [facility] about stomach pain and possible vitamin prescription

## 2014-08-31 ENCOUNTER — Ambulatory Visit: Payer: PRIVATE HEALTH INSURANCE | Admitting: Physical Therapy

## 2014-08-31 DIAGNOSIS — Z5189 Encounter for other specified aftercare: Secondary | ICD-10-CM | POA: Diagnosis not present

## 2014-09-07 ENCOUNTER — Ambulatory Visit: Payer: PRIVATE HEALTH INSURANCE

## 2014-09-07 DIAGNOSIS — Z5189 Encounter for other specified aftercare: Secondary | ICD-10-CM | POA: Diagnosis not present

## 2014-09-12 ENCOUNTER — Ambulatory Visit: Payer: PRIVATE HEALTH INSURANCE | Admitting: Physical Therapy

## 2014-09-12 DIAGNOSIS — Z5189 Encounter for other specified aftercare: Secondary | ICD-10-CM | POA: Diagnosis not present

## 2014-09-14 ENCOUNTER — Ambulatory Visit: Payer: PRIVATE HEALTH INSURANCE | Admitting: Physical Therapy

## 2014-09-14 DIAGNOSIS — Z5189 Encounter for other specified aftercare: Secondary | ICD-10-CM | POA: Diagnosis not present

## 2014-09-19 ENCOUNTER — Ambulatory Visit: Payer: PRIVATE HEALTH INSURANCE | Attending: Orthopedic Surgery | Admitting: Physical Therapy

## 2014-09-19 ENCOUNTER — Encounter: Payer: Self-pay | Admitting: Physical Therapy

## 2014-09-19 DIAGNOSIS — Z5189 Encounter for other specified aftercare: Secondary | ICD-10-CM | POA: Diagnosis present

## 2014-09-19 DIAGNOSIS — M25562 Pain in left knee: Secondary | ICD-10-CM

## 2014-09-19 NOTE — Therapy (Signed)
Physical Therapy Treatment  Patient Details  Name: Suzanne CorrenteSarah W [name] MRN: [id number] Date of Birth: 09-Feb-1966  Encounter Date: 09/19/2014      PT End of Session - 09/19/14 1126    Visit Number 9   Number of Visits 16   Date for PT Re-Evaluation 10/08/14   Authorization Type [employer]   PT Start Time 1017   PT Stop Time 1136   PT Time Calculation (min) 79 min      Past Medical History  Diagnosis Date  . Hypertension   . Thyroid disease   . Arthritis   . Hyperlipidemia   . Morbidly obese     BMI 55  . Sleep apnea     cpap  . Hypothyroidism   . GERD (gastroesophageal reflux disease)   . Asthma in adult     "seasonal"  . Gout     Past Surgical History  Procedure Laterality Date  . Shoulder Right 2011  . Hand surgery Left 2012  . Hand surgery    . Thyroid surgery  2002-2003  . Cesarean section  [id number]  . Breath tek h pylori N/A 07/19/2013    Procedure: BREATH TEK H PYLORI;  Surgeon: Suzanne PilotBrian Layton, DO;  Location: [facility];  Service: Endoscopy;  Laterality: N/A;  . Knee arthroscopy Left 06/16/2014    Procedure: ARTHROSCOPY KNEE;  Surgeon: Suzanne MustardMarcus Duda V, MD;  Location: [facility] OR;  Service: Orthopedics;  Laterality: Left;  Left Knee Arthroscopy and Debridement and chrondraplasty  . Cholecystectomy  1985  . Laparoscopic gastric sleeve resection N/A 07/04/2014    Procedure: LAPAROSCOPIC GASTRIC SLEEVE RESECTION;  Surgeon: Suzanne MerinoMatthew B Martin, MD;  Location: [facility];  Service: General;  Laterality: N/A;    There were no vitals taken for this visit.  Visit Diagnosis:  Pain in joint, lower leg, left          Adult PT Treatment/Exercise - 09/19/14 0700    Other Lumbar Machine Exercise nustep level 5, 6 minutes   Cybex Leg Press 1 plate 2 legs 20 reps, 1leg 25 reps   Lateral Step Up Left;10 reps   Forward Step Up Left;10 reps   Step Down Left;10 reps   SLS 10  Knee  slightly flexed   Other Standing Knee Exercises Hip abd, ext 2 plates 10 each leg   Long Arc Quad 1  set;Left;15 reps   Other Seated Knee Exercises Hamstring band green 10   Hip ADduction Limitations 20, red band            PT Short Term Goals - 09/19/14 1134    Title Independent with initial home exercise program   Period Weeks   Status Achieved   Title knee flexion to 102 degrees needed for greater ease for sit to stand   Time 4   Period Weeks   Status Achieved   Title Patient will have improved hip and knee extension strength to 4-/5 needed for increased community ambulation for grocery shopping   Time 4   Period Weeks          PT Long Term Goals - 09/19/14 1140    Title Independent with advanced home ex program   Time 8   Period Weeks   Title Lt knee flex to 115 degrees needed for going up and down steps reciprocally to enter home with ease   Period Weeks   Status Achieved   Title Knee extension, flexion and hip abduction strength to 4/5 needed for community ambulation in  larger stores.   Time 8   Period Weeks   Status On-going   Title FOTO functional outcome score improved fron 75%  to 50% indicating improved function with less pain   Time 8   Status On-going   Title Patient will be able to ambulate 800 feet in 6 minuted indicating adequate gait speed and endurance for safe community ambulation   Time 8   Period Weeks   Status On-going          Plan - 09/19/14 1128    Clinical Impression Statement Patient getting srtonger and more functional, pain 6/10 after exercise. Progress toward LTG #4.   PT Plan Continue strengthening . Try modified lunge        Problem List Patient Active Problem List   Diagnosis Date Noted  . Status post laparoscopic sleeve gastrectomyAugust 2015 07/04/2014  . Morbid obesity 07/04/2014  . Morbidly obese   . Asthma in adult   . Obstructive sleep apnea (adult) (pediatric) 04/08/2013                                            [name] 09/19/2014, 1:11 PM

## 2014-09-21 ENCOUNTER — Ambulatory Visit: Payer: PRIVATE HEALTH INSURANCE | Admitting: Physical Therapy

## 2014-09-21 DIAGNOSIS — Z5189 Encounter for other specified aftercare: Secondary | ICD-10-CM | POA: Diagnosis not present

## 2014-09-21 DIAGNOSIS — M25562 Pain in left knee: Secondary | ICD-10-CM

## 2014-09-21 NOTE — Therapy (Signed)
Physical Therapy Treatment  Patient Details  Name: Suzanne CorrenteSarah W [name] MRN: [id number] Date of Birth: [birth year]  Encounter Date: 09/21/2014      PT End of Session - 09/21/14 1206    Visit Number 10   Number of Visits 16   Date for PT Re-Evaluation 10/08/14   PT Start Time 1100   PT Stop Time 1205   PT Time Calculation (min) 65 min      Past Medical History  Diagnosis Date  . Hypertension   . Thyroid disease   . Arthritis   . Hyperlipidemia   . Morbidly obese     BMI 55  . Sleep apnea     cpap  . Hypothyroidism   . GERD (gastroesophageal reflux disease)   . Asthma in adult     "seasonal"  . Gout     Past Surgical History  Procedure Laterality Date  . Shoulder Right 2011  . Hand surgery Left 2012  . Hand surgery    . Thyroid surgery  2002-2003  . Cesarean section  [id number]  . Breath tek h pylori N/A 07/19/2013    Procedure: BREATH TEK H PYLORI;  Surgeon: Lodema PilotBrian Layton, DO;  Location: [facility];  Service: Endoscopy;  Laterality: N/A;  . Knee arthroscopy Left 06/16/2014    Procedure: ARTHROSCOPY KNEE;  Surgeon: Nadara MustardMarcus Duda V, MD;  Location: [facility] OR;  Service: Orthopedics;  Laterality: Left;  Left Knee Arthroscopy and Debridement and chrondraplasty  . Cholecystectomy  1985  . Laparoscopic gastric sleeve resection N/A 07/04/2014    Procedure: LAPAROSCOPIC GASTRIC SLEEVE RESECTION;  Surgeon: Valarie MerinoMatthew B Martin, MD;  Location: [facility];  Service: General;  Laterality: N/A;    There were no vitals taken for this visit.  Visit Diagnosis:  Pain in joint, lower leg, left          [facility] Adult PT Treatment/Exercise - 09/21/14 1140    Ambulation/Gait   Stairs Yes   Stairs Assistance 5: Supervision   Stair Management Technique Two rails;Alternating pattern   Number of Stairs 12   Knee/Hip Exercises: Machines for Strengthening   Cybex Leg Press 1 plate 2 legs 20 reps, 1leg 25 reps   Knee/Hip Exercises: Standing   Hip ADduction 2 sets;Strengthening  Hip machine   Lateral  Step Up 15 reps   Forward Step Up 20 reps   Step Down 5 reps   Other Standing Knee Exercises HIP machine 2 plates Extension 20 reps. Abduction too   Modalities   Modalities --   Cryotherapy   Number Minutes Cryotherapy 10 Minutes   Cryotherapy Location Knee   Type of Cryotherapy Ice pack   Manual Therapy   Edema Management 8                Plan - 09/21/14 1207    Clinical Impression Statement Pain 7-8/10 after Ice, No physical obvious expressions observed.  Able to be more functional on steps in clinic.   PT Plan strengthening, see what Md says        Problem List Patient Active Problem List   Diagnosis Date Noted  . Status post laparoscopic sleeve gastrectomyAugust 2015 07/04/2014  . Morbid obesity 07/04/2014  . Morbidly obese   . Asthma in adult   . Obstructive sleep apnea (adult) (pediatric) 04/08/2013  [name] 09/21/2014, 12:14 PM

## 2014-10-03 ENCOUNTER — Ambulatory Visit: Payer: PRIVATE HEALTH INSURANCE | Admitting: Physical Therapy

## 2014-10-03 DIAGNOSIS — M25562 Pain in left knee: Secondary | ICD-10-CM

## 2014-10-03 DIAGNOSIS — Z5189 Encounter for other specified aftercare: Secondary | ICD-10-CM | POA: Diagnosis not present

## 2014-10-03 NOTE — Therapy (Signed)
Physical Therapy Treatment  Patient Details  Name: [name] MRN: [id number] Date of Birth: 07/05/1966  Encounter Date: 10/03/2014      PT End of Session - 10/03/14 0902    Visit Number 11   Number of Visits 16   Date for PT Re-Evaluation 10/08/14   PT Start Time 0805   PT Stop Time 0900   PT Time Calculation (min) 55 min   Activity Tolerance Patient tolerated treatment well      Past Medical History  Diagnosis Date  . Hypertension   . Thyroid disease   . Arthritis   . Hyperlipidemia   . Morbidly obese     BMI 55  . Sleep apnea     cpap  . Hypothyroidism   . GERD (gastroesophageal reflux disease)   . Asthma in adult     "seasonal"  . Gout     Past Surgical History  Procedure Laterality Date  . Shoulder Right 2011  . Hand surgery Left 2012  . Hand surgery    . Thyroid surgery  2002-2003  . Cesarean section  [id number]  . Breath tek h pylori N/A 07/19/2013    Procedure: BREATH TEK H PYLORI;  Surgeon: Lodema PilotBrian Layton, DO;  Location: [facility];  Service: Endoscopy;  Laterality: N/A;  . Knee arthroscopy Left 06/16/2014    Procedure: ARTHROSCOPY KNEE;  Surgeon: Nadara MustardMarcus Duda V, MD;  Location: [facility] OR;  Service: Orthopedics;  Laterality: Left;  Left Knee Arthroscopy and Debridement and chrondraplasty  . Cholecystectomy  1985  . Laparoscopic gastric sleeve resection N/A 07/04/2014    Procedure: LAPAROSCOPIC GASTRIC SLEEVE RESECTION;  Surgeon: Valarie MerinoMatthew B Martin, MD;  Location: [facility];  Service: General;  Laterality: N/A;    There were no vitals taken for this visit.  Visit Diagnosis:  Pain in joint, lower leg, left      Subjective Assessment - 10/03/14 0810    Symptoms 7/10.  It is not slowing me down.  Still swelling but not as bad as it has been.  Walking better. Sleeping a little better. Using cane a little less.   Pain Score 7    Pain Location Knee   Pain Orientation Left   Pain Type Chronic pain   Pain Onset More than a month ago   Pain Relieving Factors rest,  ice   Effect of Pain on Daily Activities worse   Multiple Pain Sites No            [facility] Adult PT Treatment/Exercise - 10/03/14 0805    Lumbar Exercises: Machines for Strengthening   Other Lumbar Machine Exercise nustep level 6 , 368 steps   Knee/Hip Exercises: Aerobic   Stationary Bike 5 minutes  able to turn    Knee/Hip Exercises: Machines for Strengthening   Cybex Leg Press 1 plate 2 legs 20 reps, 1leg 25 reps   Total Gym Leg Press 1plate 10 reps, 2 plates 10 reps 2 legs, 1 plate 1 leg   Knee/Hip Exercises: Standing   Lateral Step Up 10 reps  each   Forward Step Up 10 reps  Each   Functional Squat 10 reps  hip hings at counter lots of cues, small motions   Modalities   Modalities --   Cryotherapy   Number Minutes Cryotherapy 10 Minutes   Cryotherapy Location Knee  Lt   Type of Cryotherapy Ice pack                Plan - 10/03/14 [id number]  PT Next Visit Plan continue strenghtening. Check hip and knee extension strength.        Problem List Patient Active Problem List   Diagnosis Date Noted  . Status post laparoscopic sleeve gastrectomyAugust 2015 07/04/2014  . Morbid obesity 07/04/2014  . Morbidly obese   . Asthma in adult   . Obstructive sleep apnea (adult) (pediatric) 04/08/2013                                              [name]  PTA  10/03/2014, 9:11 AM

## 2014-10-05 ENCOUNTER — Ambulatory Visit: Payer: PRIVATE HEALTH INSURANCE | Admitting: Physical Therapy

## 2014-10-05 DIAGNOSIS — Z5189 Encounter for other specified aftercare: Secondary | ICD-10-CM | POA: Diagnosis not present

## 2014-10-05 DIAGNOSIS — M25562 Pain in left knee: Secondary | ICD-10-CM

## 2014-10-06 ENCOUNTER — Encounter: Payer: Self-pay | Admitting: Physical Therapy

## 2014-10-06 NOTE — Therapy (Signed)
Physical Therapy Treatment  Patient Details  Name: [name] MRN: [id number] Date of Birth: [birth year]  Encounter Date: 10/05/2014      PT End of Session - 10/05/14 0903    Visit Number 12   Number of Visits 24   Date for PT Re-Evaluation 11/16/14   PT Start Time 0850   PT Stop Time 0950   PT Time Calculation (min) 60 min   Activity Tolerance Patient tolerated treatment well      Past Medical History  Diagnosis Date  . Hypertension   . Thyroid disease   . Arthritis   . Hyperlipidemia   . Morbidly obese     BMI 55  . Sleep apnea     cpap  . Hypothyroidism   . GERD (gastroesophageal reflux disease)   . Asthma in adult     "seasonal"  . Gout     Past Surgical History  Procedure Laterality Date  . Shoulder Right 2011  . Hand surgery Left 2012  . Hand surgery    . Thyroid surgery  2002-2003  . Cesarean section  [id number]  . Breath tek h pylori N/A 07/19/2013    Procedure: BREATH TEK H PYLORI;  Surgeon: Madilyn Hook, DO;  Location: [facility];  Service: Endoscopy;  Laterality: N/A;  . Knee arthroscopy Left 06/16/2014    Procedure: ARTHROSCOPY KNEE;  Surgeon: Newt Minion, MD;  Location: [place];  Service: Orthopedics;  Laterality: Left;  Left Knee Arthroscopy and Debridement and chrondraplasty  . Cholecystectomy  1985  . Laparoscopic gastric sleeve resection N/A 07/04/2014    Procedure: LAPAROSCOPIC GASTRIC SLEEVE RESECTION;  Surgeon: Pedro Earls, MD;  Location: [facility];  Service: General;  Laterality: N/A;    There were no vitals taken for this visit.  Visit Diagnosis:  Pain in joint, lower leg, left - Plan: PT plan of care cert/re-cert      Subjective Assessment - 10/06/14 0649    Symptoms "It's not too bad.  About a 6/10."  Lateral left knee.  Using RW.     How long can you walk comfortably? Small store walking OK but unable to walk in a bigger store like [employer].   Currently in Pain? Yes   Pain Score 6    Pain Location Knee   Pain Orientation Left    Pain Type Chronic pain   Aggravating Factors  prolonged walking   Multiple Pain Sites No          [facility] PT Assessment - 10/05/14 0914    Observation/Other Assessments   Focus on Therapeutic Outcomes (FOTO)  --  Improved from 75% to 53%   AROM   Left Knee Extension --  0   Left Knee Flexion --  130   Strength   Left Knee Flexion 4/5   Left Knee Extension 3+/5   6 Minute Walk- Baseline   6 Minute Walk- Baseline --  475 feet with RW          [facility] Adult PT Treatment/Exercise - 10/06/14 0001    Exercises   Exercises Knee/Hip            PT Short Term Goals - 10/06/14 0718    PT SHORT TERM GOAL #1   Title Independent with initial home exercise program   PT SHORT TERM GOAL #2   Title knee flexion to 102 degrees needed for greater ease for sit to stand   Status Achieved   PT SHORT TERM GOAL #3  Title Patient will have improved hip and knee extension strength to 4-/5 needed for increased community ambulation for grocery shopping   Status Partially Met          PT Long Term Goals - 10/06/14 0719    PT LONG TERM GOAL #1   Title  Indepedent with a comprehensive home program and basic gym program including leg press, bike and at patient's request: treadmill   Time 6   Period Weeks   Status On-going   PT LONG TERM GOAL #2   Title Lt knee flex to 115 degrees needed for going up and down steps reciprocally to enter home with ease   Status Achieved   PT LONG TERM GOAL #3   Title Knee extension, flexion and hip abduction strength to 4/5 needed for community ambulation in larger stores.   Time 6   Status On-going   PT LONG TERM GOAL #4   Title FOTO functional outcome score improved fron 75%  to 50% indicating improved function with less pain   Time 6   Status On-going   PT LONG TERM GOAL #5   Title Patient will be able to ambulate 800 feet in 6 minuted indicating adequate gait speed and endurance for safe community ambulation   Time 6   Period Weeks   Status  On-going          Plan - 10/06/14 0707    Clinical Impression Statement Although her pain level continues to be constantly in the moderate 6-7/10 range, she patient continues to be motivated with progressive strengthening of left LE.  She continues to have strength deficits in left quads 4-/5 and left glut medius 3+/5.  She is limited in community ambulation as evidenced to 6 min walk test 475 feet.  She continues to lack strength to go up and down steps reciprocally. Progress with rehab has been slowed by multi-comorbities including recent bariatric surgery.   Recommend continuation of PT 1-2x/week for 6 weeks to address remaining deficits.      Rehab Potential Good   PT Frequency 2x / week   PT Duration 6 weeks   PT Treatment/Interventions Therapeutic exercise;Electrical Stimulation;Cryotherapy;Manual techniques;Patient/family education;Therapeutic activities;Stair training   PT Next Visit Plan Progressive quad and glut medius strengthening;  practice small step reciprocal stair climbing; step ups and downs;  increase gait endurance (patient's goal is to be able to do treadmill)   PT Plan Continue 6 more weeks to address remaining strength, gait and functional deficits.          Problem List Patient Active Problem List   Diagnosis Date Noted  . Status post laparoscopic sleeve gastrectomyAugust 2015 07/04/2014  . Morbid obesity 07/04/2014  . Morbidly obese   . Asthma in adult   . Obstructive sleep apnea (adult) (pediatric) 04/08/2013                                              [name] 10/06/2014, 7:29 AM  Ruben Im, PT 10/06/2014 7:29 AM Phone: [phone] Fax: [phone]

## 2014-10-09 ENCOUNTER — Ambulatory Visit: Payer: Self-pay | Admitting: Dietician

## 2014-10-10 ENCOUNTER — Telehealth: Payer: Self-pay | Admitting: *Deleted

## 2014-10-10 ENCOUNTER — Encounter: Payer: Self-pay | Admitting: Physical Therapy

## 2014-10-10 ENCOUNTER — Ambulatory Visit: Payer: PRIVATE HEALTH INSURANCE | Admitting: Physical Therapy

## 2014-10-10 DIAGNOSIS — Z5189 Encounter for other specified aftercare: Secondary | ICD-10-CM | POA: Diagnosis not present

## 2014-10-10 DIAGNOSIS — M25562 Pain in left knee: Secondary | ICD-10-CM

## 2014-10-10 NOTE — Telephone Encounter (Signed)
appts and printed...td

## 2014-10-10 NOTE — Therapy (Signed)
Physical Therapy Treatment  Patient Details  Name: [name] MRN: [id number] Date of Birth: [birth year]  Encounter Date: 10/10/2014      PT End of Session - 10/10/14 1418    Visit Number 13   Number of Visits 24   Date for PT Re-Evaluation 11/16/14   Authorization Type [employer]   PT Start Time [phone]   PT Stop Time 0954   PT Time Calculation (min) 62 min   Activity Tolerance Patient tolerated treatment well      Past Medical History  Diagnosis Date  . Hypertension   . Thyroid disease   . Arthritis   . Hyperlipidemia   . Morbidly obese     BMI 55  . Sleep apnea     cpap  . Hypothyroidism   . GERD (gastroesophageal reflux disease)   . Asthma in adult     "seasonal"  . Gout     Past Surgical History  Procedure Laterality Date  . Shoulder Right 2011  . Hand surgery Left 2012  . Hand surgery    . Thyroid surgery  2002-2003  . Cesarean section  [id number]  . Breath tek h pylori N/A 07/19/2013    Procedure: BREATH TEK H PYLORI;  Surgeon: Madilyn Hook, DO;  Location: [facility];  Service: Endoscopy;  Laterality: N/A;  . Knee arthroscopy Left 06/16/2014    Procedure: ARTHROSCOPY KNEE;  Surgeon: Newt Minion, MD;  Location: [place];  Service: Orthopedics;  Laterality: Left;  Left Knee Arthroscopy and Debridement and chrondraplasty  . Cholecystectomy  1985  . Laparoscopic gastric sleeve resection N/A 07/04/2014    Procedure: LAPAROSCOPIC GASTRIC SLEEVE RESECTION;  Surgeon: Pedro Earls, MD;  Location: [facility];  Service: General;  Laterality: N/A;    There were no vitals taken for this visit.  Visit Diagnosis:  No diagnosis found.      Subjective Assessment - 10/10/14 0854    Symptoms Reports no excessive soreness after last visit.  Rode on motorized scooter to do [employer].  Going to cook with the help of family for Thanksgiving.     How long can you sit comfortably? No problem.   How long can you walk comfortably? Can't do extensive  walking in a store yet.     Currently in Pain? Yes   Pain Score 7    Pain Location Knee   Pain Orientation Left   Pain Type Chronic pain   Pain Onset More than a month ago   Pain Frequency Constant   Aggravating Factors  prolonged standing, walking   Pain Relieving Factors rest, ice            [facility] Adult PT Treatment/Exercise - 10/10/14 0857    Ambulation/Gait   Stairs Assistance --  6 inch steps reciprocally with 2 rails 3x   Knee/Hip Exercises: Aerobic   Stationary Bike --  5 min full revolutions   Isokinetic --  Nu-Step L5 5 min   Knee/Hip Exercises: Machines for Strengthening   Total Gym Leg Press --  40# 2 legs 20x, 20# single leg 15x right and left   Knee/Hip Exercises: Standing   Heel Raises --  15x   Forward Lunges --  Mini lunges 10x left, 10x right   Hip ADduction --  4 ways 10x, standing on right   Forward Step Up --  10x forward, 10x lateral   Step Down --  4 inch step downs 10x   SLS --  SLS with  red band pulldown diagonals 10x2   Cryotherapy   Number Minutes Cryotherapy --  10   Cryotherapy Location Knee   Type of Cryotherapy Ice pack                Plan - 10/10/14 1424    Clinical Impression Statement No additional goals met although progressing with strength as well as functional improvements on stairs.  Progress has  been slowed by other co-morbidities including bariatric surgery.        Problem List Patient Active Problem List   Diagnosis Date Noted  . Status post laparoscopic sleeve gastrectomyAugust 2015 07/04/2014  . Morbid obesity 07/04/2014  . Morbidly obese   . Asthma in adult   . Obstructive sleep apnea (adult) (pediatric) 04/08/2013                                              Alvera Singh 10/10/2014, 2:26 PM   Ruben Im, PT 10/10/2014 2:26 PM Phone: [phone] Fax: [phone]

## 2014-10-17 ENCOUNTER — Ambulatory Visit: Payer: PRIVATE HEALTH INSURANCE | Attending: Orthopedic Surgery | Admitting: Physical Therapy

## 2014-10-17 ENCOUNTER — Encounter: Payer: Self-pay | Admitting: Physical Therapy

## 2014-10-17 DIAGNOSIS — Z5189 Encounter for other specified aftercare: Secondary | ICD-10-CM | POA: Insufficient documentation

## 2014-10-17 DIAGNOSIS — M25562 Pain in left knee: Secondary | ICD-10-CM

## 2014-10-17 NOTE — Therapy (Signed)
Physical Therapy Treatment  Patient Details  Name: [name] MRN: [id number] Date of Birth: 1966/04/12  Encounter Date: 10/17/2014      PT End of Session - 10/17/14 1320    Visit Number 14   Number of Visits 24   Date for PT Re-Evaluation 11/16/14   PT Start Time 1016   PT Stop Time 1120   PT Time Calculation (min) 64 min   Activity Tolerance Patient tolerated treatment well      Past Medical History  Diagnosis Date  . Hypertension   . Thyroid disease   . Arthritis   . Hyperlipidemia   . Morbidly obese     BMI 55  . Sleep apnea     cpap  . Hypothyroidism   . GERD (gastroesophageal reflux disease)   . Asthma in adult     "seasonal"  . Gout     Past Surgical History  Procedure Laterality Date  . Shoulder Right 2011  . Hand surgery Left 2012  . Hand surgery    . Thyroid surgery  2002-2003  . Cesarean section  [id number]  . Breath tek h pylori N/A 07/19/2013    Procedure: BREATH TEK H PYLORI;  Surgeon: Madilyn Hook, DO;  Location: [facility];  Service: Endoscopy;  Laterality: N/A;  . Knee arthroscopy Left 06/16/2014    Procedure: ARTHROSCOPY KNEE;  Surgeon: Newt Minion, MD;  Location: [place];  Service: Orthopedics;  Laterality: Left;  Left Knee Arthroscopy and Debridement and chrondraplasty  . Cholecystectomy  1985  . Laparoscopic gastric sleeve resection N/A 07/04/2014    Procedure: LAPAROSCOPIC GASTRIC SLEEVE RESECTION;  Surgeon: Pedro Earls, MD;  Location: [facility];  Service: General;  Laterality: N/A;    There were no vitals taken for this visit.  Visit Diagnosis:  Pain in joint, lower leg, left      Subjective Assessment - 10/17/14 1313    Symptoms States she wasn't as painful yesterday as usual.  Joined a gym and will be working with a trainer 3x/week on Monday, Wednesday and Friday.  Starts tomorrow.   Limitations Walking   Currently in Pain? Yes   Pain Score 7    Pain Location Knee   Pain Orientation Left   Pain Type Chronic pain   Pain  Onset More than a month ago   Pain Frequency Constant   Aggravating Factors  prolonged walking and standing   Pain Relieving Factors rest, ice   Multiple Pain Sites No          [facility] PT Assessment - 10/17/14 1328    Strength   Left Knee Extension --  4-/5          [facility] Adult PT Treatment/Exercise - 10/17/14 1316    Exercises   Exercises Knee/Hip   Knee/Hip Exercises: Aerobic   Stationary Bike 6 minutes   Tread Mill 5 minutes .27mh   Isokinetic --  Nu-Step level 5 6 min   Knee/Hip Exercises: Machines for Strengthening   Total Gym Leg Press 40# bilateral;  20# single leg 25x each   Knee/Hip Exercises: Standing   Lateral Step Up --  4 inch with opposite hip abduction 12x   Forward Step Up --  12 x with opposite hip flexion   Other Standing Knee Exercises --  Up and down steps reciprocally 2 rails 3x   Cryotherapy   Number Minutes Cryotherapy 10 Minutes   Cryotherapy Location Knee   Type of Cryotherapy Ice pack  PT Short Term Goals - 10/17/14 1327    PT SHORT TERM GOAL #1   Title Independent with initial home exercise program   Status New   PT SHORT TERM GOAL #2   Title knee flexion to 102 degrees needed for greater ease for sit to stand   Status Achieved   PT SHORT TERM GOAL #3   Title Patient will have improved hip and knee extension strength to 4-/5 needed for increased community ambulation for grocery shopping   Status Achieved          PT Long Term Goals - 10/17/14 1329    PT LONG TERM GOAL #1   Title  Indepedent with a comprehensive home program and basic gym program including leg press, bike and at patient's request: treadmill   Status On-going   PT LONG TERM GOAL #2   Title Lt knee flex to 115 degrees needed for going up and down steps reciprocally to enter home with ease   Status Achieved   PT LONG TERM GOAL #3   Title Knee extension, flexion and hip abduction strength to 4/5 needed for community ambulation in larger stores.    Status On-going   PT LONG TERM GOAL #4   Title FOTO functional outcome score improved fron 75%  to 50% indicating improved function with less pain   Status On-going   PT LONG TERM GOAL #5   Title Patient will be able to ambulate 800 feet in 6 minuted indicating adequate gait speed and endurance for safe community ambulation   Status On-going          Plan - 10/17/14 1321    Clinical Impression Statement Patient remains highly motivated despite unchanging 7/10 knee pain.  All short term goals met.  Progressing toward long term goals.   She states she is walking more with her cane at home but using the RW for short community distances.  She would benefit from additional strengthening  to address left quad and gluteal muscle weakness and to promote independence with a progressive home and gym exercise program.   PT Next Visit Plan Continue with standing step ups, reciprocal stair practice, increase speed on treadmill to 1.53mh >561m;  glut medius and quad strengthening.        Problem List Patient Active Problem List   Diagnosis Date Noted  . Status post laparoscopic sleeve gastrectomyAugust 2015 07/04/2014  . Morbid obesity 07/04/2014  . Morbidly obese   . Asthma in adult   . Obstructive sleep apnea (adult) (pediatric) 04/08/2013                                              SiAlvera Singh2/11/2013, 1:32 PM    StRuben ImPT 10/17/2014 1:33 PM Phone: [phone]: [phone]

## 2014-10-20 ENCOUNTER — Encounter: Payer: Self-pay | Admitting: Physical Therapy

## 2014-10-24 ENCOUNTER — Encounter: Payer: Self-pay | Admitting: Physical Therapy

## 2014-10-24 ENCOUNTER — Ambulatory Visit: Payer: PRIVATE HEALTH INSURANCE | Admitting: Physical Therapy

## 2014-10-24 DIAGNOSIS — M25562 Pain in left knee: Secondary | ICD-10-CM

## 2014-10-24 DIAGNOSIS — Z5189 Encounter for other specified aftercare: Secondary | ICD-10-CM | POA: Diagnosis not present

## 2014-10-24 NOTE — Therapy (Signed)
[facility] [address] [place], [place], [address] Phone: [phone]   Fax:  [phone]  Physical Therapy Treatment  Patient Details  Name: [name] MRN: [id number] Date of Birth: [birth year]  Encounter Date: 10/24/2014      PT End of Session - 10/24/14 1056    Visit Number 15   Number of Visits 24   Date for PT Re-Evaluation 11/16/14   Authorization Type [employer]   PT Start Time 1005   PT Stop Time 1110   PT Time Calculation (min) 65 min   Equipment Utilized During Treatment Other (comment)  single point cane   Activity Tolerance Patient tolerated treatment well      Past Medical History  Diagnosis Date  . Hypertension   . Thyroid disease   . Arthritis   . Hyperlipidemia   . Morbidly obese     BMI 55  . Sleep apnea     cpap  . Hypothyroidism   . GERD (gastroesophageal reflux disease)   . Asthma in adult     "seasonal"  . Gout     Past Surgical History  Procedure Laterality Date  . Shoulder Right 2011  . Hand surgery Left 2012  . Hand surgery    . Thyroid surgery  2002-2003  . Cesarean section  [id number]  . Breath tek h pylori N/A 07/19/2013    Procedure: BREATH TEK H PYLORI;  Surgeon: Madilyn Hook, DO;  [facility]: [facility];  Service: Endoscopy;  Laterality: N/A;  . Knee arthroscopy Left 06/16/2014    Procedure: ARTHROSCOPY KNEE;  Surgeon: Newt Minion, MD;  Location: [place];  Service: Orthopedics;  Laterality: Left;  Left Knee Arthroscopy and Debridement and chrondraplasty  . Cholecystectomy  1985  . Laparoscopic gastric sleeve resection N/A 07/04/2014    Procedure: LAPAROSCOPIC GASTRIC SLEEVE RESECTION;  Surgeon: Pedro Earls, MD;  Location: [facility];  Service: General;  Laterality: N/A;    There were no vitals taken for this visit.  Visit Diagnosis:  Pain in joint, lower leg, left      Subjective Assessment - 10/24/14 1034    Symptoms Doing Mon, Wed, Friday at the gym.  Going OK but has not  tried the treadmill.  States she is using her cane more at home instead of the walker.  States no recent knee give-way but still feels weak.     Limitations Walking   How long can you sit comfortably? No problem.   How long can you walk comfortably? Can't do extensive walking in a store yet.     Currently in Pain? Yes   Pain Score 6    Pain Location Knee   Pain Orientation Left   Pain Type Chronic pain   Pain Frequency Constant   Aggravating Factors  prolonged walking and standing   Pain Relieving Factors ice            [facility] Adult PT Treatment/Exercise - 10/24/14 1047    Ambulation/Gait   Ambulation/Gait --  Gait in clinic with single point cane 100 feet   Stairs --  2 x 6 inch step height reciprocally with light UE use   Lumbar Exercises: Machines for Strengthening   Leg Press --  40# bilaterally, 20# single legs 20x each   Other Lumbar Machine Exercise --  Bike 10 min   Other Lumbar Machine Exercise --  Nu-Step L5 10 min   Knee/Hip Exercises: Standing   Hip ADduction --  Hip extension with red  band 15x bilaterally   Hip ADduction Limitations --  red band 15x bilaterally   Lateral Step Up --  6 inch lateral step ups 15x   Forward Step Up --  6 inch left leading with UE support 15x   Other Standing Knee Exercises --  Standing rocker board 15x   Other Standing Knee Exercises --  Heel /toe raises 15x   Cryotherapy   Number Minutes Cryotherapy 10 Minutes   Cryotherapy Location Knee   Type of Cryotherapy Ice pack              PT Long Term Goals - 10/24/14 1339    PT LONG TERM GOAL #1   Title  Indepedent with a comprehensive home program and basic gym program including leg press, bike and at patient's request: treadmill   Time 6   Period Weeks   Status On-going   PT LONG TERM GOAL #2   Title Lt knee flex to 115 degrees needed for going up and down steps reciprocally to enter home with ease   Status Achieved   PT LONG TERM GOAL #3   Title Knee extension,  flexion and hip abduction strength to 4/5 needed for community ambulation in larger stores.   Time 6   Period Weeks   Status On-going   PT LONG TERM GOAL #4   Title FOTO functional outcome score improved fron 75%  to 50% indicating improved function with less pain   Time 6   Status On-going   PT LONG TERM GOAL #5   Title Patient will be able to ambulate 800 feet in 6 minuted indicating adequate gait speed and endurance for safe community ambulation   Time 6   Period Weeks   Status On-going          Plan - 10/24/14 1341    Clinical Impression Statement Patient has not met any additional goals although she is progressing with LE strength and function.  Pain level still moderate.  Shoulder meet remaining goals in the next 2-3 weeks.  Patient is progressing with gait endurance and speed although she fatigues today and declines the treadmill.  Therapist closely monitoring pain and fewer verbal cues needed for patellofemoral alignment.  Close supervision with gait with cane for safety.     PT Next Visit Plan Gait with single point cane; increase speed on treadmill to 1.4mh for > 550m;  quad and gluteal strengthening                               Problem List Patient Active Problem List   Diagnosis Date Noted  . Status post laparoscopic sleeve gastrectomyAugust 2015 07/04/2014  . Morbid obesity 07/04/2014  . Morbidly obese   . Asthma in adult   . Obstructive sleep apnea (adult) (pediatric) 04/08/2013    SiAlvera Singh2/06/2014, 1:46 PM   StRuben ImPT 10/24/2014 1:47 PM Phone: [phone]: [phone]

## 2014-10-26 ENCOUNTER — Telehealth: Payer: Self-pay | Admitting: *Deleted

## 2014-10-26 ENCOUNTER — Ambulatory Visit: Payer: PRIVATE HEALTH INSURANCE | Admitting: Physical Therapy

## 2014-10-26 DIAGNOSIS — M25562 Pain in left knee: Secondary | ICD-10-CM

## 2014-10-26 DIAGNOSIS — Z5189 Encounter for other specified aftercare: Secondary | ICD-10-CM | POA: Diagnosis not present

## 2014-10-26 NOTE — Therapy (Signed)
[facility] [address] [place], [place], [address] Phone: [phone]   Fax:  [phone]  Physical Therapy Treatment  Patient Details  Name: [name] MRN: [id number] Date of Birth: May 22, 1966  Encounter Date: 10/26/2014      PT End of Session - 10/26/14 1014    Visit Number 16   Number of Visits 24   Date for PT Re-Evaluation 11/16/14   Authorization Type [employer]   PT Start Time [phone]   PT Stop Time 1030   PT Time Calculation (min) 59 min      Past Medical History  Diagnosis Date  . Hypertension   . Thyroid disease   . Arthritis   . Hyperlipidemia   . Morbidly obese     BMI 55  . Sleep apnea     cpap  . Hypothyroidism   . GERD (gastroesophageal reflux disease)   . Asthma in adult     "seasonal"  . Gout     Past Surgical History  Procedure Laterality Date  . Shoulder Right 2011  . Hand surgery Left 2012  . Hand surgery    . Thyroid surgery  2002-2003  . Cesarean section  [id number]  . Breath tek h pylori N/A 07/19/2013    Procedure: BREATH TEK H PYLORI;  Surgeon: Lodema PilotBrian Layton, DO;  Location: [facility];  Service: Endoscopy;  Laterality: N/A;  . Knee arthroscopy Left 06/16/2014    Procedure: ARTHROSCOPY KNEE;  Surgeon: Nadara MustardMarcus Duda V, MD;  Location: [facility];  Service: Orthopedics;  Laterality: Left;  Left Knee Arthroscopy and Debridement and chrondraplasty  . Cholecystectomy  1985  . Laparoscopic gastric sleeve resection N/A 07/04/2014    Procedure: LAPAROSCOPIC GASTRIC SLEEVE RESECTION;  Surgeon: Valarie MerinoMatthew B Martin, MD;  Location: [facility];  Service: General;  Laterality: N/A;    There were no vitals taken for this visit.  Visit Diagnosis:  Pain in joint, lower leg, left      Subjective Assessment - 10/26/14 0935    Symptoms Went to the mall with her son yesterday and used her cane instead of the walker.   Was there for about 1 hour with a 20 min sitting break.  Went to the gym yesterday and did upper body  workout.     Limitations Walking   How long can you walk comfortably? Can't do extensive walking in a store yet.     Pain Score 6    Pain Location Knee   Pain Orientation Left   Pain Type Chronic pain   Pain Onset More than a month ago   Pain Frequency Constant   Aggravating Factors  prolonged walking and standing   Pain Relieving Factors ice, sitting            [facility] Adult PT Treatment/Exercise - 10/26/14 0937    Knee/Hip Exercises: Aerobic   Stationary Bike 5 min level 2   Tread Mill 5 minutes 1.330mph   Isokinetic Nu-Step L6 6 min   Knee/Hip Exercises: Standing   Forward Step Up Left;10 reps   Rocker Board 2 minutes  with head and UE movements to challenge balance   SLS 3 ways hip   Walking with Sports Cord 5 laps FW and BW;  2x right/left sidestep   Other Standing Knee Exercises Step taps on BOSU 15x alternating   Cryotherapy   Number Minutes Cryotherapy 10 Minutes   Cryotherapy Location Knee   Type of Cryotherapy Ice pack  PT Short Term Goals - 10/26/14 1023    PT SHORT TERM GOAL #1   Title Independent with initial home exercise program   Time 4   Period Weeks   Status Achieved   PT SHORT TERM GOAL #2   Title knee flexion to 102 degrees needed for greater ease for sit to stand   Status Achieved   PT SHORT TERM GOAL #3   Title Patient will have improved hip and knee extension strength to 4-/5 needed for increased community ambulation for grocery shopping   Status Achieved          PT Long Term Goals - 10/26/14 1023    PT LONG TERM GOAL #1   Title  Indepedent with a comprehensive home program and basic gym program including leg press, bike and at patient's request: treadmill   Time 6   Period Weeks   Status On-going   PT LONG TERM GOAL #2   Title Lt knee flex to 115 degrees needed for going up and down steps reciprocally to enter home with ease   Status Achieved   PT LONG TERM GOAL #3   Title Knee extension, flexion and hip abduction  strength to 4/5 needed for community ambulation in larger stores.   Time 6   Period Weeks   Status On-going   PT LONG TERM GOAL #4   Title FOTO functional outcome score improved fron 75%  to 50% indicating improved function with less pain   Time 6   Status On-going   PT LONG TERM GOAL #5   Title Patient will be able to ambulate 800 feet in 6 minuted indicating adequate gait speed and endurance for safe community ambulation   Time 6   Period Weeks   Status On-going          Plan - 10/26/14 1015    Clinical Impression Statement The patient presents using her single point cane today.  States she went to the mall yesterday with her son for an hour and used the cane with a 20 min sitting break. Some difficulty getting on the going down escalator.  Moderate left knee pain today but patient  remains motivated with rehab and is avoid to avoid having a TKR.  Verbal cues needed to avoid toe out and therapist providing supervision for safety with dynamic standing movements.  Continue 2 weeks to promote independence with a gym and progressive home program    PT Next Visit Plan Gait with single point cane; increase  treadmill to 1.670mph for > 5min;  quad and gluteal strengthening;  6 MWT                               Problem List Patient Active Problem List   Diagnosis Date Noted  . Status post laparoscopic sleeve gastrectomyAugust 2015 07/04/2014  . Morbid obesity 07/04/2014  . Morbidly obese   . Asthma in adult   . Obstructive sleep apnea (adult) (pediatric) 04/08/2013    Vivien PrestoSimpson, Aneta Hendershott C 10/26/2014, 10:28 AM  Lavinia SharpsStacy Sarthak Rubenstein, PT 10/26/2014 10:28 AM Phone: [phone] Fax: [phone]

## 2014-10-26 NOTE — Telephone Encounter (Signed)
appts made and printed...td 

## 2014-10-31 ENCOUNTER — Ambulatory Visit: Payer: PRIVATE HEALTH INSURANCE | Admitting: Physical Therapy

## 2014-10-31 DIAGNOSIS — Z5189 Encounter for other specified aftercare: Secondary | ICD-10-CM | POA: Diagnosis not present

## 2014-10-31 DIAGNOSIS — M25562 Pain in left knee: Secondary | ICD-10-CM

## 2014-10-31 NOTE — Therapy (Signed)
[facility] [address] [place], [place], [address] Phone: [phone]   Fax:  [phone]  Physical Therapy Treatment  Patient Details  Name: [name] MRN: [id number] Date of Birth: 22-Jan-1966  Encounter Date: 10/31/2014      PT End of Session - 10/31/14 1105    Visit Number 17   Number of Visits 24   Date for PT Re-Evaluation 11/16/14   Authorization Type [employer]   PT Start Time 1015   PT Stop Time 1110   PT Time Calculation (min) 55 min   Activity Tolerance Patient tolerated treatment well  "You worked me today."      Past Medical History  Diagnosis Date  . Hypertension   . Thyroid disease   . Arthritis   . Hyperlipidemia   . Morbidly obese     BMI 55  . Sleep apnea     cpap  . Hypothyroidism   . GERD (gastroesophageal reflux disease)   . Asthma in adult     "seasonal"  . Gout     Past Surgical History  Procedure Laterality Date  . Shoulder Right 2011  . Hand surgery Left 2012  . Hand surgery    . Thyroid surgery  2002-2003  . Cesarean section  [id number]  . Breath tek h pylori N/A 07/19/2013    Procedure: BREATH TEK H PYLORI;  Surgeon: Lodema PilotBrian Layton, DO;  [facility];  Service: Endoscopy;  Laterality: N/A;  . Knee arthroscopy Left 06/16/2014    Procedure: ARTHROSCOPY KNEE;  Surgeon: Nadara MustardMarcus Duda V, MD;  Location: [facility];  Service: Orthopedics;  Laterality: Left;  Left Knee Arthroscopy and Debridement and chrondraplasty  . Cholecystectomy  1985  . Laparoscopic gastric sleeve resection N/A 07/04/2014    Procedure: LAPAROSCOPIC GASTRIC SLEEVE RESECTION;  Surgeon: Valarie MerinoMatthew B Martin, MD;  Location: [facility];  Service: General;  Laterality: N/A;    There were no vitals taken for this visit.  Visit Diagnosis:  Pain in joint, lower leg, left      Subjective Assessment - 10/31/14 1101    Symptoms States she has the return of some fluid on the side of her knee.  Wonders if she will have to go back to have  it drawn out like she did previously 1x since surgery.  Presents using her cane today.     Limitations Walking   How long can you sit comfortably? No problem.   How long can you walk comfortably? Can't do extensive walking in a store yet.     Currently in Pain? Yes   Pain Score 7    Pain Location Knee   Pain Orientation Left   Pain Descriptors / Indicators Constant   Pain Type Chronic pain   Pain Onset More than a month ago   Aggravating Factors  prolonged walking and standing   Pain Relieving Factors ice, sitting          [facility] PT Assessment - 10/31/14 0001    Observation/Other Assessments   Other Surveys  --  10 meter walk with cane 16:50 sec   6 Minute Walk- Baseline   6 Minute Walk- Baseline --  877 feet with single point cane          [facility] Adult PT Treatment/Exercise - 10/31/14 1041    Knee/Hip Exercises: Aerobic   Isokinetic Nu-Step L6 5 min   Knee/Hip Exercises: Machines for Strengthening   Cybex Leg Press 1 plate 2 legs 20 reps, 1leg 25 reps  Total Gym Leg Press 40# bilateral;    Knee/Hip Exercises: Standing   Hip ADduction Both;10 reps  red band   Forward Step Up Left;10 reps   Other Standing Knee Exercises --  SLS with foot on BOSU 2x 20 sec each   Knee/Hip Exercises: Supine   Other Supine Knee Exercises Hip extension red band 10x each   Cryotherapy   Number Minutes Cryotherapy 10 Minutes   Cryotherapy Location Knee   Type of Cryotherapy Ice pack            PT Short Term Goals - 10/31/14 1109    PT SHORT TERM GOAL #1   Title Independent with initial home exercise program   Status Achieved   PT SHORT TERM GOAL #2   Title knee flexion to 102 degrees needed for greater ease for sit to stand   Status Achieved   PT SHORT TERM GOAL #3   Title Patient will have improved hip and knee extension strength to 4-/5 needed for increased community ambulation for grocery shopping   Status Achieved          PT Long Term Goals - 10/31/14 1110    PT LONG  TERM GOAL #1   Title  Indepedent with a comprehensive home program and basic gym program including leg press, bike and at patient's request: treadmill   Time 6   Period Weeks   Status On-going   PT LONG TERM GOAL #2   Title Lt knee flex to 115 degrees needed for going up and down steps reciprocally to enter home with ease   Status Achieved   PT LONG TERM GOAL #3   Title Knee extension, flexion and hip abduction strength to 4/5 needed for community ambulation in larger stores.   Time 6   Period Weeks   Status On-going   PT LONG TERM GOAL #4   Title FOTO functional outcome score improved fron 75%  to 50% indicating improved function with less pain   Time 6   Status On-going          Plan - 10/31/14 1106    Clinical Impression Statement The patient is progressing toward long term goals with good improvement in gait speed using single point cane.  Gluteal and quad strength improving as well.  Verbal cues for patellofemoral alignment and to avoid toe out.  Therapist  monitoring for excessive pain and fatigue.  Continue 2 weeks to prepare for transition to gym and home program for further strengthening.     Rehab Potential Good   PT Next Visit Plan Gait with single point cane; increase  treadmill to 1.250mph for > 5min;  quad and gluteal strengthening                               Problem List Patient Active Problem List   Diagnosis Date Noted  . Status post laparoscopic sleeve gastrectomyAugust 2015 07/04/2014  . Morbid obesity 07/04/2014  . Morbidly obese   . Asthma in adult   . Obstructive sleep apnea (adult) (pediatric) 04/08/2013    Vivien PrestoSimpson, Vernis Cabacungan C 10/31/2014, 11:16 AM     Lavinia SharpsStacy Ahmaud Duthie, PT 10/31/2014 11:16 AM Phone: [phone] Fax: [phone]

## 2014-11-02 ENCOUNTER — Encounter: Payer: Self-pay | Admitting: Physical Therapy

## 2014-11-07 ENCOUNTER — Ambulatory Visit: Payer: PRIVATE HEALTH INSURANCE | Admitting: Physical Therapy

## 2014-11-07 DIAGNOSIS — M25562 Pain in left knee: Secondary | ICD-10-CM

## 2014-11-07 DIAGNOSIS — Z5189 Encounter for other specified aftercare: Secondary | ICD-10-CM | POA: Diagnosis not present

## 2014-11-07 NOTE — Therapy (Signed)
[facility] [address] [place], [place], [address] Phone: [phone]   Fax:  [phone]  Physical Therapy Treatment  Patient Details  Name: [name] MRN: [id number] Date of Birth: 1966-10-12  Encounter Date: 11/07/2014      PT End of Session - 11/07/14 1711    Visit Number 18   Number of Visits 24   Date for PT Re-Evaluation 11/16/14   Authorization Type [employer]   PT Start Time 0930   PT Stop Time 1025   PT Time Calculation (min) 55 min   Activity Tolerance Patient limited by pain      Past Medical History  Diagnosis Date  . Hypertension   . Thyroid disease   . Arthritis   . Hyperlipidemia   . Morbidly obese     BMI 55  . Sleep apnea     cpap  . Hypothyroidism   . GERD (gastroesophageal reflux disease)   . Asthma in adult     "seasonal"  . Gout     Past Surgical History  Procedure Laterality Date  . Shoulder Right 2011  . Hand surgery Left 2012  . Hand surgery    . Thyroid surgery  2002-2003  . Cesarean section  [id number]  . Breath tek h pylori N/A 07/19/2013    Procedure: BREATH TEK H PYLORI;  Surgeon: Lodema PilotBrian Layton, DO;  [facility];  Service: Endoscopy;  Laterality: N/A;  . Knee arthroscopy Left 06/16/2014    Procedure: ARTHROSCOPY KNEE;  Surgeon: Nadara MustardMarcus Duda V, MD;  Location: [facility];  Service: Orthopedics;  Laterality: Left;  Left Knee Arthroscopy and Debridement and chrondraplasty  . Cholecystectomy  1985  . Laparoscopic gastric sleeve resection N/A 07/04/2014    Procedure: LAPAROSCOPIC GASTRIC SLEEVE RESECTION;  Surgeon: Valarie MerinoMatthew B Martin, MD;  Location: [facility];  Service: General;  Laterality: N/A;    There were no vitals taken for this visit.  Visit Diagnosis:  Pain in joint, lower leg, left      Subjective Assessment - 11/07/14 0930    Symptoms I went to see the doctor because my knee was swollen. States the doctor thought her knee was flared up.   He gave me a shot and  that helped.   Presents with single point cane.  [place] and did OK.     Limitations Walking   How long can you sit comfortably? No problem.   How long can you walk comfortably? Limited in extensive walking.     Currently in Pain? Yes   Pain Score 6    Pain Location Knee   Pain Orientation Left   Pain Type Chronic pain   Aggravating Factors  Prolonged walking and standing   Pain Relieving Factors ice,rest                    [facility] Adult PT Treatment/Exercise - 11/07/14 0941    Lumbar Exercises: Machines for Strengthening   Other Lumbar Machine Exercise nustep level 5, 6 minutes   Knee/Hip Exercises: Aerobic   Tread Mill 5 minutes 1.660mph   Knee/Hip Exercises: Standing   Lateral Step Up --  Discontinued after 3 reps secondary to pain   Forward Step Up Left;15 reps   Walking with Sports Cord 5 laps 4 directions   Other Standing Knee Exercises --  red band hip extension 15x right/left   Knee/Hip Exercises: Seated   Long Arc Quad 1 set;Left;20  reps  4#   Other Seated Knee Exercises --  red band hip extension 10x right and left   Cryotherapy   Number Minutes Cryotherapy 10 Minutes   Cryotherapy Location Knee   Type of Cryotherapy Ice pack                  PT Short Term Goals - 11/07/14 1716    PT SHORT TERM GOAL #1   Title Independent with initial home exercise program   Status Achieved   PT SHORT TERM GOAL #2   Title knee flexion to 102 degrees needed for greater ease for sit to stand   Status Achieved   PT SHORT TERM GOAL #3   Title Patient will have improved hip and knee extension strength to 4-/5 needed for increased community ambulation for grocery shopping   Status Achieved           PT Long Term Goals - 11/07/14 1716    PT LONG TERM GOAL #1   Title  Indepedent with a comprehensive home program and basic gym program including leg press, bike and at patient's request: treadmill   Time 6   Period  Weeks   Status On-going   PT LONG TERM GOAL #2   Title Lt knee flex to 115 degrees needed for going up and down steps reciprocally to enter home with ease   Status Achieved   PT LONG TERM GOAL #3   Title Knee extension, flexion and hip abduction strength to 4/5 needed for community ambulation in larger stores.   Time 6   Period Weeks   Status On-going   PT LONG TERM GOAL #4   Title FOTO functional outcome score improved fron 75%  to 50% indicating improved function with less pain   Time 6   Status On-going   PT LONG TERM GOAL #5   Title Patient will be able to ambulate 800 feet in 6 minuted indicating adequate gait speed and endurance for safe community ambulation   Status Achieved               Plan - 11/07/14 1712    Clinical Impression Statement The patient continues to use her single point cane for the past 2 weeks despite a recent knee pain flare up.  Therapist closely monitoring all for pain and modifying as needed.  Fewer cues needed for patellofemoral alignment.  Patient may be approaching max rehab potential at this time.  Possible discharge in 1-2 visits.     PT Next Visit Plan Reassess progress toward goals next visit:  do FOTO, knee ROM, MMT, gait speed;  discharge in 1-2 visits.        Problem List Patient Active Problem List   Diagnosis Date Noted  . Status post laparoscopic sleeve gastrectomyAugust 2015 07/04/2014  . Morbid obesity 07/04/2014  . Morbidly obese   . Asthma in adult   . Obstructive sleep apnea (adult) (pediatric) 04/08/2013    Vivien PrestoSimpson, Britt Theard C 11/07/2014, 5:18 PM  [facility] [address] [place], [place], [address] Phone: [phone]   Fax:  [phone]    Lavinia SharpsStacy Shuntia Exton, PT 11/07/2014 5:19 PM Phone: [phone] Fax: [phone]

## 2014-11-09 ENCOUNTER — Ambulatory Visit: Payer: PRIVATE HEALTH INSURANCE | Admitting: Physical Therapy

## 2014-11-24 ENCOUNTER — Ambulatory Visit: Payer: Medicare Other | Admitting: Physical Therapy

## 2014-11-28 ENCOUNTER — Ambulatory Visit: Payer: Medicare Other | Attending: Orthopedic Surgery | Admitting: Physical Therapy

## 2014-11-28 DIAGNOSIS — M25562 Pain in left knee: Secondary | ICD-10-CM | POA: Insufficient documentation

## 2014-11-28 DIAGNOSIS — Z5189 Encounter for other specified aftercare: Secondary | ICD-10-CM | POA: Insufficient documentation

## 2014-11-28 NOTE — Therapy (Signed)
[place], [place], [address] Phone: [phone]   Fax:  [phone]  Physical Therapy Treatment/Re-Evaluation  Patient Details  Name: [name] MRN: [id number] Date of Birth: July 14, 1966 Referring Provider:  Merrilee Seashore, MD  Encounter Date: 11/28/2014      PT End of Session - 11/28/14 1413    Visit Number 19   Number of Visits 24   Date for PT Re-Evaluation 12/26/14   Authorization Type [employer]   PT Start Time 0930   PT Stop Time 1025   PT Time Calculation (min) 55 min   Activity Tolerance Patient tolerated treatment well      Past Medical History  Diagnosis Date  . Hypertension   . Thyroid disease   . Arthritis   . Hyperlipidemia   . Morbidly obese     BMI 55  . Sleep apnea     cpap  . Hypothyroidism   . GERD (gastroesophageal reflux disease)   . Asthma in adult     "seasonal"  . Gout     Past Surgical History  Procedure Laterality Date  . Shoulder Right 2011  . Hand surgery Left 2012  . Hand surgery    . Thyroid surgery  2002-2003  . Cesarean section  [id number]  . Breath tek h pylori N/A 07/19/2013    Procedure: BREATH TEK H PYLORI;  Surgeon: Madilyn Hook, DO;  Location: [facility];  Service: Endoscopy;  Laterality: N/A;  . Knee arthroscopy Left 06/16/2014    Procedure: ARTHROSCOPY KNEE;  Surgeon: Newt Minion, MD;  Location: [place];  Service: Orthopedics;  Laterality: Left;  Left Knee Arthroscopy and Debridement and chrondraplasty  . Cholecystectomy  1985  . Laparoscopic gastric sleeve resection N/A 07/04/2014    Procedure: LAPAROSCOPIC GASTRIC SLEEVE RESECTION;  Surgeon: Pedro Earls, MD;  Location: [facility];  Service: General;  Laterality: N/A;    There were no vitals taken for this visit.  Visit Diagnosis:  Pain in joint, lower leg, left - Plan: PT plan of care cert/re-cert      Subjective Assessment - 11/28/14 0938    Symptoms I can walk a little  without my cane now.  Able to walk some at [facility].  Still with constant swelling and pain lateral and inferior left knee.  Patient fearful of going back to the gym since she had an exacerbation from the leg press ( minimum weight still too much).     Limitations Walking   How long can you sit comfortably? No problem.   How long can you stand comfortably? Long enough   How long can you walk comfortably? Limited in extensive walking.     Patient Stated Goals Be able to go up and down steps easier with only 1 railing like I have at home;  walk some without my cane;  return to the gym   Currently in Pain? Yes   Pain Score 6    Pain Location Knee   Pain Orientation Left   Pain Descriptors / Indicators Constant   Pain Onset More than a month ago   Pain Frequency Constant   Aggravating Factors  prolonged walking in community;  rising from chair too low   Pain Relieving Factors rest          [facility] PT Assessment - 11/28/14 0944    Assessment   Next MD Visit --  Jan this week or next   Observation/Other Assessments   Focus  on Therapeutic Outcomes (FOTO)  54% limit   Other Surveys  --  Improved from 75% limit on eval but no change from 11/19   AROM   Left Knee Extension 0   Left Knee Flexion 132   Strength   Left Hip ABduction 4/5   Left Knee Flexion 4/5   Left Knee Extension 4/5   Ambulation/Gait   Ambulation/Gait --  TUG 10.31 sec with SPC   Ambulation/Gait --  TUG 10:31 sec SPC   Assistive device Straight cane   Gait Pattern Decreased stance time - left   Gait velocity --  10 M walk 10.69 sec;.84msec   6 Minute Walk- Baseline   6 Minute Walk- Baseline --  1158 feet with SPC                  [facility] Adult PT Treatment/Exercise - 11/28/14 1002    Knee/Hip Exercises: Aerobic   Stationary Bike 5 min level 2   Isokinetic Nu-Step L6 5 min   Cryotherapy   Number Minutes Cryotherapy 10 Minutes   Cryotherapy Location Knee   Type of Cryotherapy Ice pack                   PT Short Term Goals - 11/28/14 1415    PT SHORT TERM GOAL #1   Title Independent with initial home exercise program   Status Achieved   PT SHORT TERM GOAL #2   Title knee flexion to 102 degrees needed for greater ease for sit to stand   Status Achieved   PT SHORT TERM GOAL #3   Title Patient will have improved hip and knee extension strength to 4-/5 needed for increased community ambulation for grocery shopping   Status Achieved           PT Long Term Goals - 11/28/14 1415    PT LONG TERM GOAL #1   Title  Indepedent with a comprehensive home program and basic gym program including leg press, bike and at patient's request: treadmill   Time 4   Period Weeks   Status On-going   PT LONG TERM GOAL #2   Title Lt knee flex to 115 degrees needed for going up and down steps reciprocally to enter home with ease   Status Achieved   PT LONG TERM GOAL #3   Title Knee extension, flexion and hip abduction strength to 4+/5 needed for community ambulation in larger stores.   Time 4   Period Weeks   Status On-going   PT LONG TERM GOAL #4   Title FOTO functional outcome score improved fron 75%  to 50% indicating improved function with less pain   Time 4   Period Weeks   Status On-going   PT LONG TERM GOAL #5   Title Patient will be able to ambulate 800 feet in 6 minuted indicating adequate gait speed and endurance for safe community ambulation   Status Achieved   Additional Long Term Goals   Additional Long Term Goals Yes   PT LONG TERM GOAL #6   Title Patient will have LE strength to grossly 4+/5 needed to go up and down steps with 1 railing to safely enter/exit home.    Time 4   Period Weeks   Status New   PT LONG TERM GOAL #7   Title Patient will be independent in household ambulation 100-200 feet without an assistive device.   Time 4   Period Weeks   Status New  Plan - 11/28/14 1418    Clinical Impression Statement The patient  returns after a short break from therapy during the holidays.  She returns with her single point cane with a goal to decrease usage of the assistive device.  She continues to have lateral and inferior knee swelling and moderate pain at 6/10.  After a recent flare-up she has been fearful about returning to the gym.  All STGs met and partial LTGs met although no improvement in FOTO functional outcome score in the past 6 weeks.  Patient needs additional strengthening to improve safety with going up/down steps to enter/exit home with 1 railing.  Recommend 4 more weeks of PT to transition patient back to the gym, gait safety with and without the cane and further strengthening.     Pt will benefit from skilled therapeutic intervention in order to improve on the following deficits Decreased range of motion;Decreased strength;Increased edema;Pain;Difficulty walking   Rehab Potential Good   PT Frequency 2x / week   PT Duration 4 weeks   PT Treatment/Interventions ADLs/Self Care Home Management;Stair training;Therapeutic activities;Therapeutic exercise;Neuromuscular re-education;Patient/family education;Manual techniques   PT Next Visit Plan Up/down steps with 1 railing; gait without the cane?; TM;  quad and gluteal strengthening        Problem List Patient Active Problem List   Diagnosis Date Noted  . Status post laparoscopic sleeve gastrectomyAugust 2015 07/04/2014  . Morbid obesity 07/04/2014  . Morbidly obese   . Asthma in adult   . Obstructive sleep apnea (adult) (pediatric) 04/08/2013    Alvera Singh 11/28/2014, 2:31 PM  [facility] [address] [place], [place], [address] Phone: [phone]   Fax:  [phone]  Ruben Im, PT 11/28/2014 2:31 PM Phone: [phone] Fax: [phone]

## 2014-11-30 ENCOUNTER — Ambulatory Visit: Payer: Medicare Other | Admitting: Physical Therapy

## 2014-11-30 DIAGNOSIS — M25562 Pain in left knee: Secondary | ICD-10-CM | POA: Diagnosis not present

## 2014-11-30 DIAGNOSIS — Z5189 Encounter for other specified aftercare: Secondary | ICD-10-CM | POA: Diagnosis not present

## 2014-11-30 NOTE — Therapy (Signed)
[facility] [address] [place], [place], [address] Phone: [phone]   Fax:  [phone]  Physical Therapy Treatment  Patient Details  Name: [name] MRN: [id number] Date of Birth: [birth year] Referring Provider:  Nadara Mustarduda, Marcus V, MD  Encounter Date: 11/30/2014      PT End of Session - 11/30/14 1758    Visit Number 20   Number of Visits 24   Date for PT Re-Evaluation 12/26/14   Authorization Type [employer]   PT Start Time 0930   PT Stop Time 1025   PT Time Calculation (min) 55 min   Activity Tolerance Patient limited by pain      Past Medical History  Diagnosis Date  . Hypertension   . Thyroid disease   . Arthritis   . Hyperlipidemia   . Morbidly obese     BMI 55  . Sleep apnea     cpap  . Hypothyroidism   . GERD (gastroesophageal reflux disease)   . Asthma in adult     "seasonal"  . Gout     Past Surgical History  Procedure Laterality Date  . Shoulder Right 2011  . Hand surgery Left 2012  . Hand surgery    . Thyroid surgery  2002-2003  . Cesarean section  [id number]  . Breath tek h pylori N/A 07/19/2013    Procedure: BREATH TEK H PYLORI;  Surgeon: Lodema PilotBrian Layton, DO;  [facility];  Service: Endoscopy;  Laterality: N/A;  . Knee arthroscopy Left 06/16/2014    Procedure: ARTHROSCOPY KNEE;  Surgeon: Nadara MustardMarcus Duda V, MD;  [facility];  Service: Orthopedics;  Laterality: Left;  Left Knee Arthroscopy and Debridement and chrondraplasty  . Cholecystectomy  1985  . Laparoscopic gastric sleeve resection N/A 07/04/2014    Procedure: LAPAROSCOPIC GASTRIC SLEEVE RESECTION;  Surgeon: Valarie MerinoMatthew B Martin, MD;  Location: [facility];  Service: General;  Laterality: N/A;    There were no vitals taken for this visit.  Visit Diagnosis:  Pain in joint, lower leg, left      Subjective Assessment - 11/30/14 0933    Symptoms I'm more sore since being up on my knee for so long on Tuesday.  Using the cane full time.   States the front of her knee is puffier.  Plans to gradually return to the gym hopefully on Monday.     Limitations Walking   How long can you walk comfortably? Limited in extensive walking.     Patient Stated Goals Be able to go up and down steps easier with only 1 railing like I have at home;  walk some without my cane;  return to the gym   Currently in Pain? Yes   Pain Score 7    Pain Location Knee   Pain Orientation Left   Pain Descriptors / Indicators Sore   Pain Type Chronic pain   Pain Onset More than a month ago   Aggravating Factors  prolonged walking   Pain Relieving Factors rest; ice                    [facility] Adult PT Treatment/Exercise - 11/30/14 0936    Knee/Hip Exercises: Aerobic   Stationary Bike --  Nu-Step L3 LEs only 8 min   Isokinetic Nu-Step L3 5 min  LEs only   Knee/Hip Exercises: Standing   Walking with Sports Cord 2-5 laps 4 directions  black cord;  close supervision   Knee/Hip Exercises: Seated   Long  Arc [employer] 2 sets;Left;10 reps   Long Arc Quad Weight 5 lbs.   Heel Slides AROM;Left;20 reps  green band   Stool Scoot - Round Trips --  Seated green band hip flexion 20x   Other Seated Knee Exercises Hamstring band green 10   Other Seated Knee Exercises --  green band hip ER/ABD 3x10   Cryotherapy   Number Minutes Cryotherapy 10 Minutes   Cryotherapy Location Knee   Type of Cryotherapy Ice pack                  PT Short Term Goals - 11/28/14 1415    PT SHORT TERM GOAL #1   Title Independent with initial home exercise program   Status Achieved   PT SHORT TERM GOAL #2   Title knee flexion to 102 degrees needed for greater ease for sit to stand   Status Achieved   PT SHORT TERM GOAL #3   Title Patient will have improved hip and knee extension strength to 4-/5 needed for increased community ambulation for grocery shopping   Status Achieved           PT Long Term Goals - 11/30/14 1802    PT LONG TERM GOAL #1   Title   Indepedent with a comprehensive home program and basic gym program including leg press, bike and at patient's request: treadmill   Time 4   Period Weeks   Status On-going   PT LONG TERM GOAL #2   Title Lt knee flex to 115 degrees needed for going up and down steps reciprocally to enter home with ease   Status Achieved   PT LONG TERM GOAL #3   Title Knee extension, flexion and hip abduction strength to 4+/5 needed for community ambulation in larger stores.   Time 4   Period Weeks   Status On-going   PT LONG TERM GOAL #4   Title FOTO functional outcome score improved fron 75%  to 50% indicating improved function with less pain   Time 4   Period Weeks   Status On-going   PT LONG TERM GOAL #5   Title Patient will be able to ambulate 800 feet in 6 minuted indicating adequate gait speed and endurance for safe community ambulation   Status Achieved   PT LONG TERM GOAL #6   Title Patient will have LE strength to grossly 4+/5 needed to go up and down steps with 1 railing to safely enter/exit home.    Time 4   Period Weeks   Status On-going   PT LONG TERM GOAL #7   Title Patient will be independent in household ambulation 100-200 feet without an assistive device.   Time 4   Period Weeks   Status On-going               Plan - 11/30/14 1759    Clinical Impression Statement Treatment modified significantly secondary to patient complaints of increased pain from pushing herself to walk faster for the 6 minute walk test last visit.  Patient's symptoms are easily exacerbated.  No further progress toward goals.     PT Next Visit Plan If increased painful symptoms have subsided then return to Up/down steps with 1 railing; gait without the cane?; TM;  quad and gluteal strengthening        Problem List Patient Active Problem List   Diagnosis Date Noted  . Status post laparoscopic sleeve gastrectomyAugust 2015 07/04/2014  . Morbid obesity 07/04/2014  . Morbidly obese   .  Asthma in  adult   . Obstructive sleep apnea (adult) (pediatric) 04/08/2013    Vivien Presto 11/30/2014, 6:04 PM  [facility] [address] [place], [place], [address] Phone: [phone]   Fax:  [phone]  Lavinia Sharps, PT 11/30/2014 6:04 PM Phone: [phone] Fax: [phone]

## 2014-12-05 ENCOUNTER — Ambulatory Visit: Payer: Medicare Other | Admitting: Physical Therapy

## 2014-12-07 ENCOUNTER — Ambulatory Visit: Payer: Medicare Other | Admitting: Physical Therapy

## 2014-12-07 DIAGNOSIS — M25562 Pain in left knee: Secondary | ICD-10-CM | POA: Diagnosis not present

## 2014-12-07 DIAGNOSIS — M1712 Unilateral primary osteoarthritis, left knee: Secondary | ICD-10-CM | POA: Diagnosis not present

## 2014-12-11 DIAGNOSIS — M25569 Pain in unspecified knee: Secondary | ICD-10-CM | POA: Diagnosis not present

## 2014-12-11 DIAGNOSIS — G894 Chronic pain syndrome: Secondary | ICD-10-CM | POA: Diagnosis not present

## 2014-12-11 DIAGNOSIS — Z79899 Other long term (current) drug therapy: Secondary | ICD-10-CM | POA: Diagnosis not present

## 2014-12-11 DIAGNOSIS — M5136 Other intervertebral disc degeneration, lumbar region: Secondary | ICD-10-CM | POA: Diagnosis not present

## 2014-12-12 ENCOUNTER — Ambulatory Visit: Payer: Medicare Other | Admitting: Physical Therapy

## 2014-12-12 DIAGNOSIS — M25562 Pain in left knee: Secondary | ICD-10-CM | POA: Diagnosis not present

## 2014-12-12 DIAGNOSIS — Z5189 Encounter for other specified aftercare: Secondary | ICD-10-CM | POA: Diagnosis not present

## 2014-12-12 NOTE — Therapy (Signed)
[facility] [address] [place], [place], [address] Phone: [phone]   Fax:  [phone]  Physical Therapy Treatment  Patient Details  Name: [name] MRN: [id number] Date of Birth: Feb 22, 1966 Referring Provider:  Georgianne Fickamachandran, Ajith, MD  Encounter Date: 12/12/2014      PT End of Session - 12/12/14 1018    Visit Number 21   Number of Visits 24   Date for PT Re-Evaluation 12/26/14   Authorization Type [employer]   PT Start Time [phone]   PT Stop Time 1025   PT Time Calculation (min) 60 min   Activity Tolerance Patient tolerated treatment well      Past Medical History  Diagnosis Date  . Hypertension   . Thyroid disease   . Arthritis   . Hyperlipidemia   . Morbidly obese     BMI 55  . Sleep apnea     cpap  . Hypothyroidism   . GERD (gastroesophageal reflux disease)   . Asthma in adult     "seasonal"  . Gout     Past Surgical History  Procedure Laterality Date  . Shoulder Right 2011  . Hand surgery Left 2012  . Hand surgery    . Thyroid surgery  2002-2003  . Cesarean section  [id number]  . Breath tek h pylori N/A 07/19/2013    Procedure: BREATH TEK H PYLORI;  Surgeon: Lodema PilotBrian Layton, DO;  [facility]: [facility];  Service: Endoscopy;  Laterality: N/A;  . Knee arthroscopy Left 06/16/2014    Procedure: ARTHROSCOPY KNEE;  Surgeon: Nadara MustardMarcus Duda V, MD;  Location: [facility];  Service: Orthopedics;  Laterality: Left;  Left Knee Arthroscopy and Debridement and chrondraplasty  . Cholecystectomy  1985  . Laparoscopic gastric sleeve resection N/A 07/04/2014    Procedure: LAPAROSCOPIC GASTRIC SLEEVE RESECTION;  Surgeon: Valarie MerinoMatthew B Martin, MD;  [facility]: [facility];  Service: General;  Laterality: N/A;    There were no vitals taken for this visit.  Visit Diagnosis:  Pain in joint, lower leg, left      Subjective Assessment - 12/12/14 0925    Symptoms Patient states she is doing better and the doctor said she didn't have to go  back and see him.     Limitations Walking   How long can you walk comfortably? Limited in extensive walking.     Patient Stated Goals Be able to go up and down steps easier with only 1 railing like I have at home;  walk some without my cane;  return to the gym   Currently in Pain? Yes   Pain Score 6    Pain Location Knee   Pain Orientation Left   Pain Descriptors / Indicators Constant   Pain Type Chronic pain   Pain Onset More than a month ago   Aggravating Factors  prolonged walking   Pain Relieving Factors rest; ice                    [facility] Adult PT Treatment/Exercise - 12/12/14 0935    Ambulation/Gait   Stairs Assistance 5: Supervision  Gait without cane 300 feet with close supervision for safety   Knee/Hip Exercises: Stretches   Active Hamstring Stretch 3 reps;20 seconds   Gastroc Stretch 3 reps;20 seconds   Knee/Hip Exercises: Aerobic   Isokinetic Nu-Step L6 10   Knee/Hip Exercises: Seated   Long Arc Quad Weight 5 lbs.   Other Seated Knee Exercises Seated hip abd/ER green band 20x  Cryotherapy   Number Minutes Cryotherapy 10 Minutes   Cryotherapy Location Knee   Type of Cryotherapy Ice pack     Up and down steps with 1 railing reciprocally and 1 at a time descending.  Close supervision for safety.               PT Short Term Goals - 12/12/14 1024    PT SHORT TERM GOAL #1   Title Independent with initial home exercise program   Status Achieved   PT SHORT TERM GOAL #2   Title knee flexion to 102 degrees needed for greater ease for sit to stand   Status Achieved   PT SHORT TERM GOAL #3   Title Patient will have improved hip and knee extension strength to 4-/5 needed for increased community ambulation for grocery shopping   Status Achieved           PT Long Term Goals - 12/12/14 1024    PT LONG TERM GOAL #1   Title  Indepedent with a comprehensive home program and basic gym program including leg press, bike and at patient's request:  treadmill   Time 4   Period Weeks   Status On-going   PT LONG TERM GOAL #2   Title Lt knee flex to 115 degrees needed for going up and down steps reciprocally to enter home with ease   Status Achieved   PT LONG TERM GOAL #3   Title Knee extension, flexion and hip abduction strength to 4+/5 needed for community ambulation in larger stores.   Time 4   Period Weeks   Status On-going   PT LONG TERM GOAL #4   Title FOTO functional outcome score improved fron 75%  to 50% indicating improved function with less pain   Time 4   Period Weeks   Status On-going   PT LONG TERM GOAL #5   Title Patient will be able to ambulate 800 feet in 6 minuted indicating adequate gait speed and endurance for safe community ambulation   Status Achieved   Additional Long Term Goals   Additional Long Term Goals Yes   PT LONG TERM GOAL #6   Title Patient will have LE strength to grossly 4+/5 needed to go up and down steps with 1 railing to safely enter/exit home.    Time 4   Period Weeks   Status On-going   PT LONG TERM GOAL #7   Title Patient will be independent in household ambulation 100-200 feet without an assistive device.   Status Achieved               Plan - 12/12/14 1019    Clinical Impression Statement Initially, without the cane, patient has decreased left step length and flat foot pattern.  With LE stretching and verbal cuing, patient has much improved pattern and stability/balance.  Able to ascend steps reciprocally with 1 railing but has difficulty with descending steps secondary to decreased strength, pain and fear.  Less overall pain today.  Should meet remaining goals in next 1-2 weeks.     PT Next Visit Plan Continue 2 more weeks of PT.  Continue gait training without cane with focus on increased step length and heel/toe pattern; up/down stairs with 1 railing;  left LE strengthening        Problem List Patient Active Problem List   Diagnosis Date Noted  . Status post  laparoscopic sleeve gastrectomyAugust 2015 07/04/2014  . Morbid obesity 07/04/2014  . Morbidly obese   .  Asthma in adult   . Obstructive sleep apnea (adult) (pediatric) 04/08/2013    Vivien Presto 12/12/2014, 10:26 AM  [facility] [address] [place], [place], [address] Phone: [phone]   Fax:  [phone]  Lavinia Sharps, PT 12/12/2014 10:27 AM Phone: [phone] Fax: [phone]

## 2014-12-14 ENCOUNTER — Ambulatory Visit: Payer: Medicare Other | Admitting: Physical Therapy

## 2014-12-14 DIAGNOSIS — M25562 Pain in left knee: Secondary | ICD-10-CM | POA: Diagnosis not present

## 2014-12-14 DIAGNOSIS — Z5189 Encounter for other specified aftercare: Secondary | ICD-10-CM | POA: Diagnosis not present

## 2014-12-14 NOTE — Therapy (Signed)
[facility] [address] [place], [place], [address] Phone: [phone]   Fax:  [phone]  Physical Therapy Treatment  Patient Details  Name: [name] MRN: [id number] Date of Birth: July 19, 1966 Referring Provider:  Nadara Mustard, MD  Encounter Date: 12/14/2014      PT End of Session - 12/14/14 1036    Visit Number 22   Number of Visits 24   Date for PT Re-Evaluation 12/26/14   Authorization Type [employer]   PT Start Time [id number]   PT Stop Time 1026   PT Time Calculation (min) 52 min   Activity Tolerance Patient tolerated treatment well      Past Medical History  Diagnosis Date  . Hypertension   . Thyroid disease   . Arthritis   . Hyperlipidemia   . Morbidly obese     BMI 55  . Sleep apnea     cpap  . Hypothyroidism   . GERD (gastroesophageal reflux disease)   . Asthma in adult     "seasonal"  . Gout     Past Surgical History  Procedure Laterality Date  . Shoulder Right 2011  . Hand surgery Left 2012  . Hand surgery    . Thyroid surgery  2002-2003  . Cesarean section  [id number]  . Breath tek h pylori N/A 07/19/2013    Procedure: BREATH TEK H PYLORI;  Surgeon: Lodema Pilot, DO;  Location: [facility];  Service: Endoscopy;  Laterality: N/A;  . Knee arthroscopy Left 06/16/2014    Procedure: ARTHROSCOPY KNEE;  Surgeon: Nadara Mustard, MD;  [facility]: [facility];  Service: Orthopedics;  Laterality: Left;  Left Knee Arthroscopy and Debridement and chrondraplasty  . Cholecystectomy  1985  . Laparoscopic gastric sleeve resection N/A 07/04/2014    Procedure: LAPAROSCOPIC GASTRIC SLEEVE RESECTION;  Surgeon: Valarie Merino, MD;  Location: [facility];  Service: General;  Laterality: N/A;    There were no vitals taken for this visit.  Visit Diagnosis:  Pain in joint, lower leg, left      Subjective Assessment - 12/14/14 0942    Symptoms It's sore today.  My husband noticed that I"m walking better.  I've been  practicing.     Limitations Walking   Pain Score 6    Pain Location Knee   Pain Orientation Left   Pain Type Chronic pain   Pain Onset More than a month ago   Pain Frequency Constant   Aggravating Factors  prolonged walking                    [facility] Adult PT Treatment/Exercise - 12/14/14 0944    Ambulation/Gait   Ambulation/Gait Yes  Without cane 200 ft. with supervision   Stairs Yes  reciprocal with 1 railing 4 steps 5x.   Stairs Assistance 5: Supervision   Lumbar Exercises: Machines for Strengthening   Cybex Knee Flexion 30# B/R/L 15x each   Knee/Hip Exercises: Aerobic   Tread Mill 1.1 mph 8 min   Isokinetic Nu-Step L6 10   Cryotherapy   Number Minutes Cryotherapy 10 Minutes   Cryotherapy Location Knee   Type of Cryotherapy Ice pack                  PT Short Term Goals - 12/14/14 1042    PT SHORT TERM GOAL #1   Title Independent with initial home exercise program   Status Achieved   PT SHORT TERM GOAL #2   Title  knee flexion to 102 degrees needed for greater ease for sit to stand   Status Achieved   PT SHORT TERM GOAL #3   Title Patient will have improved hip and knee extension strength to 4-/5 needed for increased community ambulation for grocery shopping   Status Achieved           PT Long Term Goals - 12/14/14 1042    PT LONG TERM GOAL #1   Title  Indepedent with a comprehensive home program and basic gym program including leg press, bike and at patient's request: treadmill   Time 4   Period Weeks   Status On-going   PT LONG TERM GOAL #2   Title Lt knee flex to 115 degrees needed for going up and down steps reciprocally to enter home with ease   Status Achieved   PT LONG TERM GOAL #3   Title Knee extension, flexion and hip abduction strength to 4+/5 needed for community ambulation in larger stores.   Time 4   Period Weeks   Status On-going   PT LONG TERM GOAL #4   Title FOTO functional outcome score improved fron 75%  to 50%  indicating improved function with less pain   Time 4   Period Weeks   Status On-going   PT LONG TERM GOAL #5   Title Patient will be able to ambulate 800 feet in 6 minuted indicating adequate gait speed and endurance for safe community ambulation   Status Achieved   PT LONG TERM GOAL #6   Title Patient will have LE strength to grossly 4+/5 needed to go up and down steps with 1 railing to safely enter/exit home.    Time 4   Period Weeks   Status On-going   PT LONG TERM GOAL #7   Title Patient will be independent in household ambulation 100-200 feet without an assistive device.   Time 4   Status Achieved               Plan - 12/14/14 1037    Clinical Impression Statement Much improved heel strike and left LE stance time.  Also improved gait fluidity on stairs reciprocally with less knee pain.  Decreased dependence on the cane.  Anticipate meeting remaining LTGs in next 2-3 visits.     PT Next Visit Plan Probable discharge in 2-3 visits.  Continue with stair training, gait without cane, Left LE strengthening        Problem List Patient Active Problem List   Diagnosis Date Noted  . Status post laparoscopic sleeve gastrectomyAugust 2015 07/04/2014  . Morbid obesity 07/04/2014  . Morbidly obese   . Asthma in adult   . Obstructive sleep apnea (adult) (pediatric) 04/08/2013    Vivien PrestoSimpson, Keltin Baird C 12/14/2014, 10:49 AM  [facility] [address] [place], [place], [address] Phone: [phone]   Fax:  [phone]   Lavinia SharpsStacy Elner Seifert, PT 12/14/2014 10:49 AM Phone: [phone] Fax: [phone]

## 2014-12-19 ENCOUNTER — Ambulatory Visit: Payer: Medicare Other | Attending: Orthopedic Surgery | Admitting: Physical Therapy

## 2014-12-19 DIAGNOSIS — M25562 Pain in left knee: Secondary | ICD-10-CM | POA: Insufficient documentation

## 2014-12-19 DIAGNOSIS — Z5189 Encounter for other specified aftercare: Secondary | ICD-10-CM | POA: Diagnosis not present

## 2014-12-19 NOTE — Therapy (Signed)
[place], [place], [address] Phone: [phone]   Fax:  [phone]  Physical Therapy Treatment  Patient Details  Name: [name] MRN: [id number] Date of Birth: [birth year] Referring Provider:  Merrilee Seashore, MD  Encounter Date: 12/19/2014      PT End of Session - 12/19/14 1044    Visit Number 23   Number of Visits 24   Date for PT Re-Evaluation 12/26/14   Authorization Type [employer]   PT Start Time 7425   PT Stop Time 1105   PT Time Calculation (min) 50 min   Activity Tolerance Patient tolerated treatment well      Past Medical History  Diagnosis Date  . Hypertension   . Thyroid disease   . Arthritis   . Hyperlipidemia   . Morbidly obese     BMI 55  . Sleep apnea     cpap  . Hypothyroidism   . GERD (gastroesophageal reflux disease)   . Asthma in adult     "seasonal"  . Gout     Past Surgical History  Procedure Laterality Date  . Shoulder Right 2011  . Hand surgery Left 2012  . Hand surgery    . Thyroid surgery  2002-2003  . Cesarean section  [id number]  . Breath tek h pylori N/A 07/19/2013    Procedure: BREATH TEK H PYLORI;  Surgeon: Madilyn Hook, DO;  [facility];  Service: Endoscopy;  Laterality: N/A;  . Knee arthroscopy Left 06/16/2014    Procedure: ARTHROSCOPY KNEE;  Surgeon: Newt Minion, MD;  Location: [facility];  Service: Orthopedics;  Laterality: Left;  Left Knee Arthroscopy and Debridement and chrondraplasty  . Cholecystectomy  1985  . Laparoscopic gastric sleeve resection N/A 07/04/2014    Procedure: LAPAROSCOPIC GASTRIC SLEEVE RESECTION;  Surgeon: Pedro Earls, MD;  [facility];  Service: General;  Laterality: N/A;    There were no vitals taken for this visit.  Visit Diagnosis:  Pain in joint, lower leg, left      Subjective Assessment - 12/19/14 1029    Symptoms Patient reports her (lateral knee) is swollen and a little more painful today.   Walking without the cane a lot more.  Reports she is proud because she walked around [employer] pushing the cart only by myself.  The rolling walker is spending more time in the corner per patient report.     Limitations Walking   How long can you sit comfortably? No problem.   Currently in Pain? Yes   Pain Score 7    Pain Orientation Left   Aggravating Factors  prolonged walking   Pain Relieving Factors rest; ice                    [facility] Adult PT Treatment/Exercise - 12/19/14 1037    Ambulation/Gait   Ambulation/Gait --  Gait without cane 200 feet independently   Stairs Yes   Lumbar Exercises: Machines for Strengthening   Cybex Knee Flexion 30# B/R/L 15x each   Knee/Hip Exercises: Aerobic   Tread Mill 1.1 mph 8 min   Knee/Hip Exercises: Seated   Long Arc Quad 2 sets;Left;10 reps   Long Arc Quad Weight 6 lbs.   Heel Slides --  hip flexion green band 20x right/left   Other Seated Knee Exercises Hamstring band green 10   Other Seated Knee Exercises Seated hip abd/ER green band 20x   Cryotherapy   Number Minutes Cryotherapy  10 Minutes   Cryotherapy Location Knee   Type of Cryotherapy Ice pack                  PT Short Term Goals - 12/19/14 1454    PT SHORT TERM GOAL #1   Title Independent with initial home exercise program   Status Achieved   PT SHORT TERM GOAL #2   Title knee flexion to 102 degrees needed for greater ease for sit to stand   Status Achieved   PT SHORT TERM GOAL #3   Title Patient will have improved hip and knee extension strength to 4-/5 needed for increased community ambulation for grocery shopping   Status Achieved           PT Long Term Goals - 12/19/14 1455    PT LONG TERM GOAL #1   Title  Indepedent with a comprehensive home program and basic gym program including leg press, bike and at patient's request: treadmill   Time 4   Period Weeks   Status On-going   PT LONG TERM GOAL #2   Title Lt knee flex to 115 degrees  needed for going up and down steps reciprocally to enter home with ease   Status Achieved   PT LONG TERM GOAL #3   Title Knee extension, flexion and hip abduction strength to 4+/5 needed for community ambulation in larger stores.   Time 4   Period Weeks   Status On-going   PT LONG TERM GOAL #4   Title FOTO functional outcome score improved fron 75%  to 50% indicating improved function with less pain   Time 4   Period Weeks   Status On-going   PT LONG TERM GOAL #5   Title Patient will be able to ambulate 800 feet in 6 minuted indicating adequate gait speed and endurance for safe community ambulation   Status Achieved   PT LONG TERM GOAL #6   Title Patient will have LE strength to grossly 4+/5 needed to go up and down steps with 1 railing to safely enter/exit home.    Time 4   Period Weeks   Status On-going   PT LONG TERM GOAL #7   Title Patient will be independent in household ambulation 100-200 feet without an assistive device.   Status Achieved               Plan - 12/19/14 1451    Clinical Impression Statement Therapist closely monitoring pain throughout treatment session and modifying exercises as needed.  Patient ambulating on level surfaces without cane shorter community distances despite persistent knee pain 7/10.  She is independent with setting herself up on the treadmill.  She is progressing with fluidity of reciprocal stair climbing with 1 railing to enter /exit home safely.  Plan for discharge next visit.  Majority of goals should be met.          Problem List Patient Active Problem List   Diagnosis Date Noted  . Status post laparoscopic sleeve gastrectomyAugust 2015 07/04/2014  . Morbid obesity 07/04/2014  . Morbidly obese   . Asthma in adult   . Obstructive sleep apnea (adult) (pediatric) 04/08/2013    Alvera Singh 12/19/2014, 2:56 PM  [facility] [address] [place], [place], [address] Phone:  [phone]   Fax:  [phone]   Ruben Im, PT 12/19/2014 2:57 PM Phone: [phone] Fax: [phone]

## 2014-12-21 ENCOUNTER — Encounter: Payer: Self-pay | Admitting: Physical Therapy

## 2014-12-22 ENCOUNTER — Encounter: Payer: Self-pay | Admitting: Physical Therapy

## 2014-12-28 DIAGNOSIS — E662 Morbid (severe) obesity with alveolar hypoventilation: Secondary | ICD-10-CM | POA: Diagnosis not present

## 2014-12-28 DIAGNOSIS — I10 Essential (primary) hypertension: Secondary | ICD-10-CM | POA: Diagnosis not present

## 2014-12-28 DIAGNOSIS — I2510 Atherosclerotic heart disease of native coronary artery without angina pectoris: Secondary | ICD-10-CM | POA: Diagnosis not present

## 2014-12-28 DIAGNOSIS — Z9884 Bariatric surgery status: Secondary | ICD-10-CM | POA: Diagnosis not present

## 2015-01-02 ENCOUNTER — Ambulatory Visit: Payer: Medicare Other | Admitting: Physical Therapy

## 2015-01-02 DIAGNOSIS — Z5189 Encounter for other specified aftercare: Secondary | ICD-10-CM | POA: Diagnosis not present

## 2015-01-02 DIAGNOSIS — M25562 Pain in left knee: Secondary | ICD-10-CM | POA: Diagnosis not present

## 2015-01-02 NOTE — Therapy (Signed)
[place], [place], [address] Phone: [phone]   Fax:  [phone]  Physical Therapy Treatment  Patient Details  Name: [name] MRN: [id number] Date of Birth: 01-12-1966 Referring Provider:  Newt Minion, MD  Encounter Date: 01/02/2015      PT End of Session - 01/02/15 1001    Visit Number 24   Number of Visits 24   Date for PT Re-Evaluation 01/02/15   Authorization Type [employer]   PT Start Time 0930   PT Stop Time 1030   PT Time Calculation (min) 60 min   Activity Tolerance Patient tolerated treatment well      Past Medical History  Diagnosis Date  . Hypertension   . Thyroid disease   . Arthritis   . Hyperlipidemia   . Morbidly obese     BMI 55  . Sleep apnea     cpap  . Hypothyroidism   . GERD (gastroesophageal reflux disease)   . Asthma in adult     "seasonal"  . Gout     Past Surgical History  Procedure Laterality Date  . Shoulder Right 2011  . Hand surgery Left 2012  . Hand surgery    . Thyroid surgery  2002-2003  . Cesarean section  [id number]  . Breath tek h pylori N/A 07/19/2013    Procedure: BREATH TEK H PYLORI;  Surgeon: Madilyn Hook, DO;  [facility]: [facility];  Service: Endoscopy;  Laterality: N/A;  . Knee arthroscopy Left 06/16/2014    Procedure: ARTHROSCOPY KNEE;  Surgeon: Newt Minion, MD;  Location: [facility];  Service: Orthopedics;  Laterality: Left;  Left Knee Arthroscopy and Debridement and chrondraplasty  . Cholecystectomy  1985  . Laparoscopic gastric sleeve resection N/A 07/04/2014    Procedure: LAPAROSCOPIC GASTRIC SLEEVE RESECTION;  Surgeon: Pedro Earls, MD;  Location: [facility];  Service: General;  Laterality: N/A;    There were no vitals taken for this visit.  Visit Diagnosis:  Pain in joint, lower leg, left      Subjective Assessment - 01/02/15 0935    Symptoms Patient states she had an accident yesterday in the snow/ice.  Did not get hurt.   Presents with the cane.  Hurts around the knee cap. Lost 73# total.  The heart doctor said I didn't have to go back for a whole year.     Limitations Walking;House hold activities   How long can you sit comfortably? 2 hours   How long can you stand comfortably? 30 min   How long can you walk comfortably? 30 minutes   Patient Stated Goals Be able to go up and down steps easier with only 1 railing like I have at home;  walk some without my cane;  return to the gym   Currently in Pain? Yes   Pain Score 7    Pain Location Knee   Pain Onset More than a month ago   Pain Frequency Constant   Aggravating Factors  prolonged standing or sitting; prolonged walking   Pain Relieving Factors meds, ice or heat          [facility] PT Assessment - 01/02/15 0943    Observation/Other Assessments   Focus on Therapeutic Outcomes (FOTO)  Improved to 50%   AROM   Left Knee Extension 0   Left Knee Flexion 135   Strength   Left Hip ABduction 4/5   Left Knee Flexion 4+/5   Left Knee Extension 4/5  [facility] Adult PT Treatment/Exercise - 01/16/15 0957    Knee/Hip Exercises: Aerobic   Isokinetic Nu-Step L2 10 min   Knee/Hip Exercises: Seated   Long Arc Quad 2 sets;Left;10 reps   Long Arc Quad Weight 5 lbs.   Other Seated Knee Exercises HS pull downs 15# single leg 25x   Cryotherapy   Number Minutes Cryotherapy 10 Minutes   Cryotherapy Location Knee   Type of Cryotherapy Ice pack                  PT Short Term Goals - 16-Jan-2015 1808    PT SHORT TERM GOAL #3   Title Patient will have improved hip and knee extension strength to 4-/5 needed for increased community ambulation for grocery shopping   Status Achieved           PT Long Term Goals - 2015/01/16 1005    PT LONG TERM GOAL #1   Title  Indepedent with a comprehensive home program and basic gym program including leg press, bike and at patient's request: treadmill   Status Achieved   PT LONG TERM GOAL #2   Title  Lt knee flex to 115 degrees needed for going up and down steps reciprocally to enter home with ease   Status Achieved   PT LONG TERM GOAL #3   Title Knee extension, flexion and hip abduction strength to 4+/5 needed for community ambulation in larger stores.   Status Partially Met   PT LONG TERM GOAL #4   Title FOTO functional outcome score improved fron 75%  to 50% indicating improved function with less pain   Status Achieved   PT LONG TERM GOAL #5   Title Patient will be able to ambulate 800 feet in 6 minuted indicating adequate gait speed and endurance for safe community ambulation   Status Achieved   PT LONG TERM GOAL #6   Title Patient will have LE strength to grossly 4+/5 needed to go up and down steps with 1 railing to safely enter/exit home.    Status Partially Met   PT LONG TERM GOAL #7   Title Patient will be independent in household ambulation 100-200 feet without an assistive device.   Status Achieved               Plan - 16-Jan-2015 1805    Clinical Impression Statement The patient has met the majority of goals, with excellent improvement in knee ROM and LE strength.  She has progressed from a walker to a single point cane for community distances but often uses no device in the house.  She is able to go up and down steps often reciprocally with 1 railing assist.  She made signficant improvement in FOTO functional outcome score.  Exercises were progressed slowly over the long course of PT secondary to her knee pain was easily exacerbated with intermediate level and especially weight bearing exercises.  The patient plans to continue with a home and gym exercise program.  Discharge from PT with partial goals met.            G-Codes - 01/16/15 1006    Functional Assessment Tool Used FOTO; clinical judgement   Functional Limitation Mobility: Walking and moving around   Mobility: Walking and Moving Around Current Status (F8182) At least 40 percent but less than 60 percent  impaired, limited or restricted   Mobility: Walking and Moving Around Goal Status (X9371) At least 40 percent but less than 60 percent impaired, limited or restricted  Mobility: Walking and Moving Around Discharge Status [phone]) At least 40 percent but less than 60 percent impaired, limited or restricted      Problem List Patient Active Problem List   Diagnosis Date Noted  . Status post laparoscopic sleeve gastrectomyAugust 2015 07/04/2014  . Morbid obesity 07/04/2014  . Morbidly obese   . Asthma in adult   . Obstructive sleep apnea (adult) (pediatric) 04/08/2013    Alvera Singh 01/02/2015, 6:13 PM  [facility] [address] [place], [place], [address] Phone: [phone]   Fax:  [phone]   PHYSICAL THERAPY DISCHARGE SUMMARY  Visits from Start of Care: 24  Current functional level related to goals / functional outcomes: See clinical impressions above.  Majority of goals met.     Remaining deficits: Knee pain exacerbated with prolonged walking/standing or extensive weight bearing exercises.  Education / Equipment: HEP and gym program Plan: Patient agrees to discharge.  Patient goals were partially met. Patient is being discharged due to meeting the stated rehab goals.  ?????  Ruben Im, PT 01/02/2015 6:15 PM Phone: [phone] Fax: [phone]

## 2015-01-05 DIAGNOSIS — G4733 Obstructive sleep apnea (adult) (pediatric): Secondary | ICD-10-CM | POA: Diagnosis not present

## 2015-01-08 DIAGNOSIS — G894 Chronic pain syndrome: Secondary | ICD-10-CM | POA: Diagnosis not present

## 2015-01-08 DIAGNOSIS — M25569 Pain in unspecified knee: Secondary | ICD-10-CM | POA: Diagnosis not present

## 2015-01-29 DIAGNOSIS — G894 Chronic pain syndrome: Secondary | ICD-10-CM | POA: Diagnosis not present

## 2015-01-29 DIAGNOSIS — M25569 Pain in unspecified knee: Secondary | ICD-10-CM | POA: Diagnosis not present

## 2015-02-26 DIAGNOSIS — M47817 Spondylosis without myelopathy or radiculopathy, lumbosacral region: Secondary | ICD-10-CM | POA: Diagnosis not present

## 2015-02-26 DIAGNOSIS — Z79899 Other long term (current) drug therapy: Secondary | ICD-10-CM | POA: Diagnosis not present

## 2015-02-26 DIAGNOSIS — M549 Dorsalgia, unspecified: Secondary | ICD-10-CM | POA: Diagnosis not present

## 2015-02-26 DIAGNOSIS — M545 Low back pain: Secondary | ICD-10-CM | POA: Diagnosis not present

## 2015-02-26 DIAGNOSIS — M5137 Other intervertebral disc degeneration, lumbosacral region: Secondary | ICD-10-CM | POA: Diagnosis not present

## 2015-02-26 DIAGNOSIS — G894 Chronic pain syndrome: Secondary | ICD-10-CM | POA: Diagnosis not present

## 2015-02-26 DIAGNOSIS — M25569 Pain in unspecified knee: Secondary | ICD-10-CM | POA: Diagnosis not present

## 2015-03-15 DIAGNOSIS — M545 Low back pain: Secondary | ICD-10-CM | POA: Diagnosis not present

## 2015-03-15 DIAGNOSIS — M79609 Pain in unspecified limb: Secondary | ICD-10-CM | POA: Diagnosis not present

## 2015-03-21 DIAGNOSIS — G894 Chronic pain syndrome: Secondary | ICD-10-CM | POA: Diagnosis not present

## 2015-03-21 DIAGNOSIS — M170 Bilateral primary osteoarthritis of knee: Secondary | ICD-10-CM | POA: Diagnosis not present

## 2015-03-21 DIAGNOSIS — M25569 Pain in unspecified knee: Secondary | ICD-10-CM | POA: Diagnosis not present

## 2015-03-21 DIAGNOSIS — M549 Dorsalgia, unspecified: Secondary | ICD-10-CM | POA: Diagnosis not present

## 2015-03-23 DIAGNOSIS — E039 Hypothyroidism, unspecified: Secondary | ICD-10-CM | POA: Diagnosis not present

## 2015-03-23 DIAGNOSIS — I2510 Atherosclerotic heart disease of native coronary artery without angina pectoris: Secondary | ICD-10-CM | POA: Diagnosis not present

## 2015-03-23 DIAGNOSIS — I10 Essential (primary) hypertension: Secondary | ICD-10-CM | POA: Diagnosis not present

## 2015-03-23 DIAGNOSIS — Z79899 Other long term (current) drug therapy: Secondary | ICD-10-CM | POA: Diagnosis not present

## 2015-03-23 DIAGNOSIS — E782 Mixed hyperlipidemia: Secondary | ICD-10-CM | POA: Diagnosis not present

## 2015-03-23 DIAGNOSIS — E538 Deficiency of other specified B group vitamins: Secondary | ICD-10-CM | POA: Diagnosis not present

## 2015-03-30 DIAGNOSIS — E039 Hypothyroidism, unspecified: Secondary | ICD-10-CM | POA: Diagnosis not present

## 2015-03-30 DIAGNOSIS — E782 Mixed hyperlipidemia: Secondary | ICD-10-CM | POA: Diagnosis not present

## 2015-03-30 DIAGNOSIS — I10 Essential (primary) hypertension: Secondary | ICD-10-CM | POA: Diagnosis not present

## 2015-03-30 DIAGNOSIS — I2510 Atherosclerotic heart disease of native coronary artery without angina pectoris: Secondary | ICD-10-CM | POA: Diagnosis not present

## 2015-04-02 ENCOUNTER — Encounter: Payer: Self-pay | Admitting: Dietician

## 2015-04-02 ENCOUNTER — Encounter: Payer: Medicare Other | Attending: Internal Medicine | Admitting: Dietician

## 2015-04-02 DIAGNOSIS — Z6841 Body Mass Index (BMI) 40.0 and over, adult: Secondary | ICD-10-CM | POA: Insufficient documentation

## 2015-04-02 DIAGNOSIS — Z713 Dietary counseling and surveillance: Secondary | ICD-10-CM | POA: Insufficient documentation

## 2015-04-02 NOTE — Patient Instructions (Addendum)
Goals:  Follow Phase 3B: High Protein + Non Starchy Vegetables  Eat 3-6 small meals/snacks, every 3-5 hrs  Increase lean protein foods to meet 60g goal  Have a protein shake on days when you can't eat as much (Unjury)  Increase fluid intake to 64oz +  Avoid drinking 15 minutes before, during and 30 minutes after eating  Aim for >30 min of physical activity daily   Plan to walk every day for 10 minutes and increase when you can   Separate calcium and multivitamin by at least 2 hours  Take 1500 mg of calcium citrate (500 mg at a time) and 2 x adult dose of complete multivitamin (morning and night)  Try taking a picture of yourself and compare to ones from before surgery  Surgery date: 07/04/2014 Surgery type: Sleeve Gastrectomy [facility]: [facility] lbs on 06/13/2014 Weight today: 245.5 lbs   Weight change: 45.5 lbs Total weight loss: 84.5 lbs Weight loss goal: 190 lbs  TANITA  BODY COMP RESULTS  06/19/14 07/20/14 08/29/14 04/02/15   BMI (kg/m^2) 56.9 54.3 50.0 42.1   Fat Mass (lbs) 197.0 178.0 163.0 129.0   Fat Free Mass (lbs) 134.5 138.5 128.0 116.5   Total Body Water (lbs) 98.5 101.5 93.5 85.5

## 2015-04-02 NOTE — Progress Notes (Signed)
  Follow-up visit:  9 Months Post-Operative Sleeve Gastrectomy Surgery  Medical Nutrition Therapy:  Appt start time: 1040 end time:  1110.  Primary concerns today: Post-operative Bariatric Surgery Nutrition Management. Things have been going well. Returns with a 45.5 lb weight loss since October. Still having knee pains. Some days feels hungrier than others. Tolerating foods she is eating. Acid reflux is better.   Threw up on Friday after "forcing" herself to eat.  Not meeting protein goals. Recommended adding the Unjury Chicken Soup back in diet on days when she can't eat very much.   Surgery date: 07/04/2014 Surgery type: Sleeve Gastrectomy [facility] lbs on 06/13/2014 Weight today: 245.5 lbs   Weight change: 45.5 lbs, 34 lbs fat mass loss Total weight loss: 84.5 lbs Weight loss goal: 190 lbs  TANITA  BODY COMP RESULTS  06/19/14 07/20/14 08/29/14 04/02/15   BMI (kg/m^2) 56.9 54.3 50.0 42.1   Fat Mass (lbs) 197.0 178.0 163.0 129.0   Fat Free Mass (lbs) 134.5 138.5 128.0 116.5   Total Body Water (lbs) 98.5 101.5 93.5 85.5    Preferred Learning Style:   No preference indicated   Learning Readiness:   Ready  24-hr recall: B (AM): 1 egg with cheese (12 g) Snk (AM): sometimes having cheese sharp 4 slices (6 g)  L (PM): 2-3 oz boiled chicken leg or wing with string beans (14-21 g) Snk (PM):  sometimes having cheese sharp 4 slices (6 g)  D (PM): 2-3 oz baked chicken with greens (14-21 g) Snk (PM): none  Fluid intake: 6 oz decaf coffee with splenda and sometimes milk, 8 oz Wyler's Lemonade, 51-68 oz sips on water   Estimated total protein intake: 40-66 g  Medications: see list  Supplementation: taking  Using straws: No Drinking while eating: No, sometimes doesn't wait 30 minutes to drink   Hair loss: had some but thinks it is better  Carbonated beverages: No N/V/D/C: vomited on Friday after "forcing" herself to eat, having constipation and taking Miralax in her  coffee Dumping syndrome: No  Recent physical activity:  going to physical therapy 3 x week  Progress Towards Goal(s):  In progress.   Nutritional Diagnosis:  [place]-3.3 Overweight/obesity related to past poor dietary habits and physical inactivity as evidenced by patient w/ recent Sleeve Gastrectomy surgery following dietary guidelines for continued weight loss.    Intervention:  Nutrition education/diet reinforcement Goals:  Follow Phase 3B: High Protein + Non Starchy Vegetables  Eat 3-6 small meals/snacks, every 3-5 hrs  Increase lean protein foods to meet 60g goal  Have a protein shake on days when you can't eat as much (Unjury)  Increase fluid intake to 64oz +  Avoid drinking 15 minutes before, during and 30 minutes after eating  Aim for >30 min of physical activity daily   Plan to walk every day for 10 minutes and increase when you can   Separate calcium and multivitamin by at least 2 hours  Take 1500 mg of calcium citrate (500 mg at a time) and 2 x adult dose of complete multivitamin (morning and night)  Try taking a picture of yourself and compare to ones from before surgery   Teaching Method Utilized:  Visual Auditory Hands on  Barriers to learning/adherence to lifestyle change: knee pain and stress  Demonstrated degree of understanding via:  Teach Back   Monitoring/Evaluation:  Dietary intake, exercise, and body weight. Follow up in 3 Months for 12 month post-op visit.

## 2015-04-18 DIAGNOSIS — M549 Dorsalgia, unspecified: Secondary | ICD-10-CM | POA: Diagnosis not present

## 2015-04-18 DIAGNOSIS — R05 Cough: Secondary | ICD-10-CM | POA: Diagnosis not present

## 2015-04-18 DIAGNOSIS — J22 Unspecified acute lower respiratory infection: Secondary | ICD-10-CM | POA: Diagnosis not present

## 2015-04-18 DIAGNOSIS — M25569 Pain in unspecified knee: Secondary | ICD-10-CM | POA: Diagnosis not present

## 2015-04-18 DIAGNOSIS — G894 Chronic pain syndrome: Secondary | ICD-10-CM | POA: Diagnosis not present

## 2015-05-07 DIAGNOSIS — M545 Low back pain: Secondary | ICD-10-CM | POA: Diagnosis not present

## 2015-05-07 DIAGNOSIS — M5137 Other intervertebral disc degeneration, lumbosacral region: Secondary | ICD-10-CM | POA: Diagnosis not present

## 2015-05-07 DIAGNOSIS — M47817 Spondylosis without myelopathy or radiculopathy, lumbosacral region: Secondary | ICD-10-CM | POA: Diagnosis not present

## 2015-05-14 DIAGNOSIS — Z79899 Other long term (current) drug therapy: Secondary | ICD-10-CM | POA: Diagnosis not present

## 2015-05-14 DIAGNOSIS — M25569 Pain in unspecified knee: Secondary | ICD-10-CM | POA: Diagnosis not present

## 2015-05-14 DIAGNOSIS — G894 Chronic pain syndrome: Secondary | ICD-10-CM | POA: Diagnosis not present

## 2015-05-14 DIAGNOSIS — M549 Dorsalgia, unspecified: Secondary | ICD-10-CM | POA: Diagnosis not present

## 2015-05-17 DIAGNOSIS — Z9889 Other specified postprocedural states: Secondary | ICD-10-CM | POA: Diagnosis not present

## 2015-05-17 DIAGNOSIS — Z9884 Bariatric surgery status: Secondary | ICD-10-CM | POA: Diagnosis not present

## 2015-06-11 DIAGNOSIS — M25569 Pain in unspecified knee: Secondary | ICD-10-CM | POA: Diagnosis not present

## 2015-06-11 DIAGNOSIS — M545 Low back pain: Secondary | ICD-10-CM | POA: Diagnosis not present

## 2015-06-11 DIAGNOSIS — G894 Chronic pain syndrome: Secondary | ICD-10-CM | POA: Diagnosis not present

## 2015-06-28 DIAGNOSIS — E538 Deficiency of other specified B group vitamins: Secondary | ICD-10-CM | POA: Diagnosis not present

## 2015-07-02 DIAGNOSIS — H5203 Hypermetropia, bilateral: Secondary | ICD-10-CM | POA: Diagnosis not present

## 2015-07-02 DIAGNOSIS — I10 Essential (primary) hypertension: Secondary | ICD-10-CM | POA: Diagnosis not present

## 2015-07-02 DIAGNOSIS — H52223 Regular astigmatism, bilateral: Secondary | ICD-10-CM | POA: Diagnosis not present

## 2015-07-02 DIAGNOSIS — H524 Presbyopia: Secondary | ICD-10-CM | POA: Diagnosis not present

## 2015-07-03 ENCOUNTER — Ambulatory Visit: Payer: Self-pay | Admitting: Dietician

## 2015-07-09 DIAGNOSIS — Z79899 Other long term (current) drug therapy: Secondary | ICD-10-CM | POA: Diagnosis not present

## 2015-07-09 DIAGNOSIS — M545 Low back pain: Secondary | ICD-10-CM | POA: Diagnosis not present

## 2015-07-09 DIAGNOSIS — G894 Chronic pain syndrome: Secondary | ICD-10-CM | POA: Diagnosis not present

## 2015-07-09 DIAGNOSIS — M549 Dorsalgia, unspecified: Secondary | ICD-10-CM | POA: Diagnosis not present

## 2015-07-09 DIAGNOSIS — M25569 Pain in unspecified knee: Secondary | ICD-10-CM | POA: Diagnosis not present

## 2015-07-30 DIAGNOSIS — E538 Deficiency of other specified B group vitamins: Secondary | ICD-10-CM | POA: Diagnosis not present

## 2015-08-08 DIAGNOSIS — M4697 Unspecified inflammatory spondylopathy, lumbosacral region: Secondary | ICD-10-CM | POA: Diagnosis not present

## 2015-08-08 DIAGNOSIS — Z79899 Other long term (current) drug therapy: Secondary | ICD-10-CM | POA: Diagnosis not present

## 2015-08-08 DIAGNOSIS — G894 Chronic pain syndrome: Secondary | ICD-10-CM | POA: Diagnosis not present

## 2015-08-08 DIAGNOSIS — M25569 Pain in unspecified knee: Secondary | ICD-10-CM | POA: Diagnosis not present

## 2015-08-08 DIAGNOSIS — M4806 Spinal stenosis, lumbar region: Secondary | ICD-10-CM | POA: Diagnosis not present

## 2015-08-16 DIAGNOSIS — I2510 Atherosclerotic heart disease of native coronary artery without angina pectoris: Secondary | ICD-10-CM | POA: Diagnosis not present

## 2015-08-16 DIAGNOSIS — E538 Deficiency of other specified B group vitamins: Secondary | ICD-10-CM | POA: Diagnosis not present

## 2015-08-16 DIAGNOSIS — E782 Mixed hyperlipidemia: Secondary | ICD-10-CM | POA: Diagnosis not present

## 2015-08-16 DIAGNOSIS — I10 Essential (primary) hypertension: Secondary | ICD-10-CM | POA: Diagnosis not present

## 2015-08-16 DIAGNOSIS — E039 Hypothyroidism, unspecified: Secondary | ICD-10-CM | POA: Diagnosis not present

## 2015-08-24 DIAGNOSIS — Z0000 Encounter for general adult medical examination without abnormal findings: Secondary | ICD-10-CM | POA: Diagnosis not present

## 2015-08-24 DIAGNOSIS — E039 Hypothyroidism, unspecified: Secondary | ICD-10-CM | POA: Diagnosis not present

## 2015-08-24 DIAGNOSIS — Z23 Encounter for immunization: Secondary | ICD-10-CM | POA: Diagnosis not present

## 2015-08-24 DIAGNOSIS — I10 Essential (primary) hypertension: Secondary | ICD-10-CM | POA: Diagnosis not present

## 2015-08-24 DIAGNOSIS — E782 Mixed hyperlipidemia: Secondary | ICD-10-CM | POA: Diagnosis not present

## 2015-09-03 DIAGNOSIS — M545 Low back pain: Secondary | ICD-10-CM | POA: Diagnosis not present

## 2015-09-05 DIAGNOSIS — M4806 Spinal stenosis, lumbar region: Secondary | ICD-10-CM | POA: Diagnosis not present

## 2015-09-05 DIAGNOSIS — Z79899 Other long term (current) drug therapy: Secondary | ICD-10-CM | POA: Diagnosis not present

## 2015-09-05 DIAGNOSIS — M4697 Unspecified inflammatory spondylopathy, lumbosacral region: Secondary | ICD-10-CM | POA: Diagnosis not present

## 2015-09-05 DIAGNOSIS — G894 Chronic pain syndrome: Secondary | ICD-10-CM | POA: Diagnosis not present

## 2015-09-05 DIAGNOSIS — M792 Neuralgia and neuritis, unspecified: Secondary | ICD-10-CM | POA: Diagnosis not present

## 2015-09-27 DIAGNOSIS — Z9884 Bariatric surgery status: Secondary | ICD-10-CM | POA: Diagnosis not present

## 2015-10-03 DIAGNOSIS — G894 Chronic pain syndrome: Secondary | ICD-10-CM | POA: Diagnosis not present

## 2015-10-03 DIAGNOSIS — M4806 Spinal stenosis, lumbar region: Secondary | ICD-10-CM | POA: Diagnosis not present

## 2015-10-03 DIAGNOSIS — Z79899 Other long term (current) drug therapy: Secondary | ICD-10-CM | POA: Diagnosis not present

## 2015-10-03 DIAGNOSIS — M4697 Unspecified inflammatory spondylopathy, lumbosacral region: Secondary | ICD-10-CM | POA: Diagnosis not present

## 2015-10-22 DIAGNOSIS — J069 Acute upper respiratory infection, unspecified: Secondary | ICD-10-CM | POA: Diagnosis not present

## 2015-10-26 DIAGNOSIS — Z79899 Other long term (current) drug therapy: Secondary | ICD-10-CM | POA: Diagnosis not present

## 2015-10-26 DIAGNOSIS — G894 Chronic pain syndrome: Secondary | ICD-10-CM | POA: Diagnosis not present

## 2015-10-29 DIAGNOSIS — Z79899 Other long term (current) drug therapy: Secondary | ICD-10-CM | POA: Diagnosis not present

## 2015-10-29 DIAGNOSIS — E538 Deficiency of other specified B group vitamins: Secondary | ICD-10-CM | POA: Diagnosis not present

## 2015-10-29 DIAGNOSIS — G894 Chronic pain syndrome: Secondary | ICD-10-CM | POA: Diagnosis not present

## 2015-10-29 DIAGNOSIS — M545 Low back pain: Secondary | ICD-10-CM | POA: Diagnosis not present

## 2015-11-26 DIAGNOSIS — M792 Neuralgia and neuritis, unspecified: Secondary | ICD-10-CM | POA: Diagnosis not present

## 2015-11-26 DIAGNOSIS — G894 Chronic pain syndrome: Secondary | ICD-10-CM | POA: Diagnosis not present

## 2015-11-26 DIAGNOSIS — Z79899 Other long term (current) drug therapy: Secondary | ICD-10-CM | POA: Diagnosis not present

## 2015-11-26 DIAGNOSIS — M4806 Spinal stenosis, lumbar region: Secondary | ICD-10-CM | POA: Diagnosis not present

## 2015-12-05 DIAGNOSIS — M792 Neuralgia and neuritis, unspecified: Secondary | ICD-10-CM | POA: Diagnosis not present

## 2015-12-05 DIAGNOSIS — M545 Low back pain: Secondary | ICD-10-CM | POA: Diagnosis not present

## 2015-12-05 DIAGNOSIS — Z79899 Other long term (current) drug therapy: Secondary | ICD-10-CM | POA: Diagnosis not present

## 2015-12-05 DIAGNOSIS — G894 Chronic pain syndrome: Secondary | ICD-10-CM | POA: Diagnosis not present

## 2015-12-06 DIAGNOSIS — M4806 Spinal stenosis, lumbar region: Secondary | ICD-10-CM | POA: Diagnosis not present

## 2015-12-06 DIAGNOSIS — Z79899 Other long term (current) drug therapy: Secondary | ICD-10-CM | POA: Diagnosis not present

## 2015-12-06 DIAGNOSIS — M792 Neuralgia and neuritis, unspecified: Secondary | ICD-10-CM | POA: Diagnosis not present

## 2015-12-06 DIAGNOSIS — G894 Chronic pain syndrome: Secondary | ICD-10-CM | POA: Diagnosis not present

## 2015-12-17 DIAGNOSIS — G894 Chronic pain syndrome: Secondary | ICD-10-CM | POA: Diagnosis not present

## 2015-12-17 DIAGNOSIS — M792 Neuralgia and neuritis, unspecified: Secondary | ICD-10-CM | POA: Diagnosis not present

## 2015-12-17 DIAGNOSIS — Z79899 Other long term (current) drug therapy: Secondary | ICD-10-CM | POA: Diagnosis not present

## 2015-12-17 DIAGNOSIS — M4806 Spinal stenosis, lumbar region: Secondary | ICD-10-CM | POA: Diagnosis not present

## 2015-12-24 DIAGNOSIS — M47817 Spondylosis without myelopathy or radiculopathy, lumbosacral region: Secondary | ICD-10-CM | POA: Diagnosis not present

## 2015-12-24 DIAGNOSIS — I10 Essential (primary) hypertension: Secondary | ICD-10-CM | POA: Diagnosis not present

## 2015-12-24 DIAGNOSIS — I2510 Atherosclerotic heart disease of native coronary artery without angina pectoris: Secondary | ICD-10-CM | POA: Diagnosis not present

## 2015-12-24 DIAGNOSIS — E7800 Pure hypercholesterolemia, unspecified: Secondary | ICD-10-CM | POA: Diagnosis not present

## 2016-01-12 ENCOUNTER — Emergency Department (HOSPITAL_COMMUNITY): Payer: Medicare Other

## 2016-01-12 ENCOUNTER — Encounter (HOSPITAL_COMMUNITY): Payer: Self-pay

## 2016-01-12 ENCOUNTER — Emergency Department (HOSPITAL_COMMUNITY)
Admission: EM | Admit: 2016-01-12 | Discharge: 2016-01-12 | Disposition: A | Discharge status: Home / Self Care | Payer: Medicare Other | Attending: Emergency Medicine | Admitting: Emergency Medicine

## 2016-01-12 DIAGNOSIS — E785 Hyperlipidemia, unspecified: Secondary | ICD-10-CM | POA: Diagnosis not present

## 2016-01-12 DIAGNOSIS — K219 Gastro-esophageal reflux disease without esophagitis: Secondary | ICD-10-CM | POA: Insufficient documentation

## 2016-01-12 DIAGNOSIS — M1990 Unspecified osteoarthritis, unspecified site: Secondary | ICD-10-CM | POA: Insufficient documentation

## 2016-01-12 DIAGNOSIS — Z79899 Other long term (current) drug therapy: Secondary | ICD-10-CM | POA: Diagnosis not present

## 2016-01-12 DIAGNOSIS — Z9981 Dependence on supplemental oxygen: Secondary | ICD-10-CM | POA: Insufficient documentation

## 2016-01-12 DIAGNOSIS — I10 Essential (primary) hypertension: Secondary | ICD-10-CM | POA: Diagnosis not present

## 2016-01-12 DIAGNOSIS — Z7982 Long term (current) use of aspirin: Secondary | ICD-10-CM | POA: Insufficient documentation

## 2016-01-12 DIAGNOSIS — M109 Gout, unspecified: Secondary | ICD-10-CM | POA: Insufficient documentation

## 2016-01-12 DIAGNOSIS — E039 Hypothyroidism, unspecified: Secondary | ICD-10-CM | POA: Diagnosis not present

## 2016-01-12 DIAGNOSIS — Z87891 Personal history of nicotine dependence: Secondary | ICD-10-CM | POA: Diagnosis not present

## 2016-01-12 DIAGNOSIS — M25562 Pain in left knee: Secondary | ICD-10-CM | POA: Insufficient documentation

## 2016-01-12 DIAGNOSIS — G4730 Sleep apnea, unspecified: Secondary | ICD-10-CM | POA: Diagnosis not present

## 2016-01-12 DIAGNOSIS — J45909 Unspecified asthma, uncomplicated: Secondary | ICD-10-CM | POA: Insufficient documentation

## 2016-01-12 DIAGNOSIS — G8929 Other chronic pain: Secondary | ICD-10-CM | POA: Diagnosis not present

## 2016-01-12 DIAGNOSIS — M25462 Effusion, left knee: Secondary | ICD-10-CM | POA: Diagnosis not present

## 2016-01-12 DIAGNOSIS — M7989 Other specified soft tissue disorders: Secondary | ICD-10-CM | POA: Diagnosis not present

## 2016-01-12 MED ORDER — OXYCODONE-ACETAMINOPHEN 5-325 MG PO TABS: 2.0000 | ORAL_TABLET | ORAL | Status: AC | PRN

## 2016-01-12 MED ORDER — MORPHINE SULFATE (PF) 4 MG/ML IV SOLN
4.0000 mg | Freq: Once | INTRAVENOUS | Status: AC
  Administered 2016-01-12: 4 mg via INTRAMUSCULAR
  Filled 2016-01-12: qty 1

## 2016-01-12 MED ORDER — OXYCODONE-ACETAMINOPHEN 5-325 MG PO TABS
2.0000 | ORAL_TABLET | Freq: Once | ORAL | Status: AC
  Administered 2016-01-12: 2 via ORAL
  Filled 2016-01-12: qty 2

## 2016-01-12 MED ORDER — KETOROLAC TROMETHAMINE 60 MG/2ML IM SOLN
60.0000 mg | Freq: Once | INTRAMUSCULAR | Status: AC
  Administered 2016-01-12: 60 mg via INTRAMUSCULAR
  Filled 2016-01-12: qty 2

## 2016-01-12 MED ORDER — ONDANSETRON 4 MG PO TBDP
4.0000 mg | ORAL_TABLET | Freq: Once | ORAL | Status: AC | PRN
  Administered 2016-01-12: 4 mg via ORAL
  Filled 2016-01-12: qty 1

## 2016-01-12 NOTE — ED Provider Notes (Signed)
CSN: [id number]     Arrival date & time 01/12/16  2054 History   First MD Initiated Contact with Patient 01/12/16 2105     Chief Complaint  Patient presents with  . Knee Pain     (Consider location/radiation/quality/duration/timing/severity/associated sxs/prior Treatment) HPI   [name] is a 50 y.o. female, with a history of hypertension, arthritis, and gout, presenting to the ED with acute on chronic left knee pain that recurred yesterday. Pt states that her knee usually chronically aches, but yesterday the pain increased. Patient had an arthroscopic meniscal tear repair performed in July 2015 by Dr. Lajoyce Corners. Pt has had to have fluid drained off of the knee since then. Pt denies falls or trauma to the knee. Pt rates her pain at 10/10, achy/throbbing, nonradiating. Pt denies fever/chills, N/V, neuro deficits, or any other complaints. Pt usually does not require aids for ambulation, but has been using a walker lately due to the pain.  Past Medical History  Diagnosis Date  . Hypertension   . Thyroid disease   . Arthritis   . Hyperlipidemia   . Morbidly obese (HCC)     BMI 55  . Sleep apnea     cpap  . Hypothyroidism   . GERD (gastroesophageal reflux disease)   . Asthma in adult     "seasonal"  . Gout    Past Surgical History  Procedure Laterality Date  . Shoulder Right 2011  . Hand surgery Left 2012  . Hand surgery    . Thyroid surgery  2002-2003  . Cesarean section  [id number]  . Breath tek h pylori N/A 07/19/2013    Procedure: BREATH TEK H PYLORI;  Surgeon: Lodema Pilot, DO;  Location: [facility];  Service: Endoscopy;  Laterality: N/A;  . Knee arthroscopy Left 06/16/2014    Procedure: ARTHROSCOPY KNEE;  Surgeon: Nadara Mustard, MD;  Location: [facility];  Service: Orthopedics;  Laterality: Left;  Left Knee Arthroscopy and Debridement and chrondraplasty  . Cholecystectomy  1985  . Laparoscopic gastric sleeve resection N/A 07/04/2014    Procedure: LAPAROSCOPIC GASTRIC SLEEVE  RESECTION;  Surgeon: Valarie Merino, MD;  Location: [facility];  Service: General;  Laterality: N/A;   Family History  Problem Relation Age of Onset  . Coronary artery disease Sister   . Diabetes Brother   . Stroke Father   . Hypertension Other    Social History  Substance Use Topics  . Smoking status: Former Smoker    Types: Cigarettes    Quit date: 03/17/2005  . Smokeless tobacco: Never Used  . Alcohol Use: No   OB History    No data available     Review of Systems  Musculoskeletal: Positive for arthralgias (left knee).  Skin: Negative for color change and pallor.  Neurological: Negative for weakness and numbness.      Allergies  Ibuprofen; Lyrica; Dilaudid; and Tape  Home Medications   Prior to Admission medications   Medication Sig Start Date End Date Taking? Authorizing Provider  amLODipine (NORVASC) 10 MG tablet Take 10 mg by mouth every morning.  02/21/13   Historical Provider, MD  aspirin EC 81 MG tablet Take 81 mg by mouth every morning.    Historical Provider, MD  atenolol (TENORMIN) 25 MG tablet Take 25 mg by mouth every morning.     Historical Provider, MD  colchicine 0.6 MG tablet Take 0.6 mg by mouth 2 (two) times daily as needed. Flairs 06/07/14   Historical Provider, MD  CRESTOR 40  MG tablet Take 40 mg by mouth every morning.  03/01/13   Historical Provider, MD  DEXILANT 30 MG capsule Take 30 mg by mouth daily.  05/13/14   Historical Provider, MD  fluticasone (FLONASE) 50 MCG/ACT nasal spray Place 1 spray into the nose daily as needed for allergies.  05/04/14   Historical Provider, MD  HYDROcodone-acetaminophen (HYCET) 7.5-325 mg/15 ml solution Take 15 mLs by mouth every 4 (four) hours as needed for moderate pain. 06/29/14   Luretha Murphy, MD  levothyroxine (SYNTHROID, LEVOTHROID) 150 MCG tablet Take 125 mcg by mouth daily before breakfast.     Historical Provider, MD  nitroGLYCERIN (NITROSTAT) 0.4 MG SL tablet Place 0.4 mg under the tongue every 5 (five) minutes  as needed for chest pain.    Historical Provider, MD  oxycodone (ROXICODONE) 30 MG immediate release tablet Take 30 mg by mouth every 8 (eight) hours as needed for pain.  05/20/14   Historical Provider, MD  oxyCODONE-acetaminophen (ROXICET) 5-325 MG per tablet Take 1 tablet by mouth every 4 (four) hours as needed for severe pain. 06/16/14   Nadara Mustard, MD  oxymorphone (OPANA ER) 40 MG 12 hr tablet Take 40 mg by mouth 2 (two) times daily as needed for pain.    Historical Provider, MD  PENNSAID 2 % SOLN Apply 1 application topically 2 (two) times daily as needed (pain).  05/18/14   Historical Provider, MD   BP 155/98 mmHg  Pulse 53  Temp(Src) 98.6 F (37 C) (Oral)  Resp 16  SpO2 100%  LMP 10/21/2015 (Approximate) Physical Exam  Constitutional: She appears well-developed and well-nourished. No distress.  HENT:  Head: Normocephalic and atraumatic.  Eyes: Conjunctivae are normal.  Cardiovascular: Normal rate, regular rhythm and intact distal pulses.   Pulmonary/Chest: Effort normal.  Musculoskeletal: She exhibits edema and tenderness.  Swelling and tenderness to the left knee with signs of effusion noted. No erythema, crepitus, deformity, increased warmth, or any other abnormalities noted. Range of motion intact, but painful.  Neurological: She is alert.  No sensory deficits. Strength 5 out of 5.  Skin: Skin is warm and dry. She is not diaphoretic.  Nursing note and vitals reviewed.   ED Course  Procedures (including critical care time) Labs Review Labs Reviewed - No data to display  Imaging Review Dg Knee Complete 4 Views Left  01/12/2016  CLINICAL DATA:  Knee pain and swelling for 2 days with no known injury, initial encounter EXAM: LEFT KNEE - COMPLETE 4+ VIEW COMPARISON:  None. FINDINGS: Degenerative changes are noted in all 3 joint compartments. No joint effusion is seen. No acute fracture or dislocation is noted. No soft tissue changes are seen. IMPRESSION: Degenerative change  without acute abnormality. Electronically Signed   By: Alcide Clever M.D.   On: 01/12/2016 21:37   I have personally reviewed and evaluated these images as part of my medical decision-making.   EKG Interpretation None      MDM   Final diagnoses:  Knee pain, chronic, left    [name] presents with acute on chronic left knee pain and swelling recurring yesterday.  Findings and plan of care discussed with Doug Sou, MD.  Suspicion is very low for septic joint in this patient. Patient has no risk factors. She has no pain out of proportion with exam. Patient has no systemic symptoms. The patient has been through this issue before and has never had an infection or gout in that joint. Patient was given the option  to have an arthrocentesis performed here in the ED, but was told that she would need to wait for the results. The patient opted instead to see Dr. Lajoyce Corners on Monday for further evaluation. Patient agreed to return to the ED should her symptoms worsen. Upon reevaluation, the patient states that her pain has significantly improved. Patient given a prescription for pain medication that would last her until she can see Dr. Lajoyce Corners next week. Patient appears safe for discharge at this time.  Filed Vitals:   01/12/16 2100 01/12/16 2248  BP: 155/98 129/72  Pulse: 53 52  Temp: 98.6 F (37 C) 98.4 F (36.9 C)  TempSrc: Oral Oral  Resp: 16 17  SpO2: 100% 93%     Anselm Pancoast, PA-C 01/12/16 2336  Doug Sou, MD 01/12/16 2355

## 2016-01-12 NOTE — Discharge Instructions (Signed)
You have been seen today for knee pain. Your imaging showed no abnormalities. Follow up with an appointment with Dr. Lajoyce Corners as soon as possible. Follow up with PCP as needed. Return to ED should symptoms worsen. Continue to take your home pain medication as directed.

## 2016-01-12 NOTE — ED Notes (Signed)
Pt having left knee pain and swelling since yesterday. No known injury.

## 2016-01-16 DIAGNOSIS — G894 Chronic pain syndrome: Secondary | ICD-10-CM | POA: Diagnosis not present

## 2016-01-16 DIAGNOSIS — Z79899 Other long term (current) drug therapy: Secondary | ICD-10-CM | POA: Diagnosis not present

## 2016-01-16 DIAGNOSIS — M792 Neuralgia and neuritis, unspecified: Secondary | ICD-10-CM | POA: Diagnosis not present

## 2016-01-16 DIAGNOSIS — M4806 Spinal stenosis, lumbar region: Secondary | ICD-10-CM | POA: Diagnosis not present

## 2016-01-16 DIAGNOSIS — E538 Deficiency of other specified B group vitamins: Secondary | ICD-10-CM | POA: Diagnosis not present

## 2016-01-16 DIAGNOSIS — M4697 Unspecified inflammatory spondylopathy, lumbosacral region: Secondary | ICD-10-CM | POA: Diagnosis not present

## 2016-02-04 DIAGNOSIS — Z79899 Other long term (current) drug therapy: Secondary | ICD-10-CM | POA: Diagnosis not present

## 2016-02-04 DIAGNOSIS — M792 Neuralgia and neuritis, unspecified: Secondary | ICD-10-CM | POA: Diagnosis not present

## 2016-02-04 DIAGNOSIS — M4697 Unspecified inflammatory spondylopathy, lumbosacral region: Secondary | ICD-10-CM | POA: Diagnosis not present

## 2016-02-04 DIAGNOSIS — G894 Chronic pain syndrome: Secondary | ICD-10-CM | POA: Diagnosis not present

## 2016-02-04 DIAGNOSIS — M4806 Spinal stenosis, lumbar region: Secondary | ICD-10-CM | POA: Diagnosis not present

## 2016-02-13 DIAGNOSIS — G894 Chronic pain syndrome: Secondary | ICD-10-CM | POA: Diagnosis not present

## 2016-02-13 DIAGNOSIS — M47817 Spondylosis without myelopathy or radiculopathy, lumbosacral region: Secondary | ICD-10-CM | POA: Diagnosis not present

## 2016-02-13 DIAGNOSIS — M4696 Unspecified inflammatory spondylopathy, lumbar region: Secondary | ICD-10-CM | POA: Diagnosis not present

## 2016-02-13 DIAGNOSIS — M5137 Other intervertebral disc degeneration, lumbosacral region: Secondary | ICD-10-CM | POA: Diagnosis not present

## 2016-03-03 DIAGNOSIS — E782 Mixed hyperlipidemia: Secondary | ICD-10-CM | POA: Diagnosis not present

## 2016-03-03 DIAGNOSIS — I2510 Atherosclerotic heart disease of native coronary artery without angina pectoris: Secondary | ICD-10-CM | POA: Diagnosis not present

## 2016-03-03 DIAGNOSIS — M5441 Lumbago with sciatica, right side: Secondary | ICD-10-CM | POA: Diagnosis not present

## 2016-03-07 DIAGNOSIS — Z79899 Other long term (current) drug therapy: Secondary | ICD-10-CM | POA: Diagnosis not present

## 2016-03-07 DIAGNOSIS — F1190 Opioid use, unspecified, uncomplicated: Secondary | ICD-10-CM | POA: Diagnosis not present

## 2016-03-07 DIAGNOSIS — M5441 Lumbago with sciatica, right side: Secondary | ICD-10-CM | POA: Diagnosis not present

## 2016-03-17 DIAGNOSIS — F1190 Opioid use, unspecified, uncomplicated: Secondary | ICD-10-CM | POA: Diagnosis not present

## 2016-03-17 DIAGNOSIS — I10 Essential (primary) hypertension: Secondary | ICD-10-CM | POA: Diagnosis not present

## 2016-03-17 DIAGNOSIS — E538 Deficiency of other specified B group vitamins: Secondary | ICD-10-CM | POA: Diagnosis not present

## 2016-03-27 ENCOUNTER — Ambulatory Visit: Payer: Self-pay | Admitting: Family Medicine

## 2016-04-17 DIAGNOSIS — M5441 Lumbago with sciatica, right side: Secondary | ICD-10-CM | POA: Diagnosis not present

## 2016-05-15 DIAGNOSIS — Z79891 Long term (current) use of opiate analgesic: Secondary | ICD-10-CM | POA: Diagnosis not present

## 2016-05-15 DIAGNOSIS — M5441 Lumbago with sciatica, right side: Secondary | ICD-10-CM | POA: Diagnosis not present

## 2016-06-16 DIAGNOSIS — Z79899 Other long term (current) drug therapy: Secondary | ICD-10-CM | POA: Diagnosis not present

## 2016-06-16 DIAGNOSIS — M545 Low back pain: Secondary | ICD-10-CM | POA: Diagnosis not present

## 2016-06-16 DIAGNOSIS — M25511 Pain in right shoulder: Secondary | ICD-10-CM | POA: Diagnosis not present

## 2016-06-16 DIAGNOSIS — G8929 Other chronic pain: Secondary | ICD-10-CM | POA: Diagnosis not present

## 2016-06-17 DIAGNOSIS — F1190 Opioid use, unspecified, uncomplicated: Secondary | ICD-10-CM | POA: Diagnosis not present

## 2016-06-17 DIAGNOSIS — E782 Mixed hyperlipidemia: Secondary | ICD-10-CM | POA: Diagnosis not present

## 2016-06-17 DIAGNOSIS — E538 Deficiency of other specified B group vitamins: Secondary | ICD-10-CM | POA: Diagnosis not present

## 2016-06-17 DIAGNOSIS — E039 Hypothyroidism, unspecified: Secondary | ICD-10-CM | POA: Diagnosis not present

## 2016-06-17 DIAGNOSIS — Z79899 Other long term (current) drug therapy: Secondary | ICD-10-CM | POA: Diagnosis not present

## 2016-07-08 ENCOUNTER — Encounter (HOSPITAL_COMMUNITY): Payer: Self-pay | Admitting: Emergency Medicine

## 2016-07-08 ENCOUNTER — Emergency Department (HOSPITAL_COMMUNITY)
Admission: EM | Admit: 2016-07-08 | Discharge: 2016-07-09 | Disposition: A | Discharge status: Home / Self Care | Payer: Medicare Other | Source: Home / Self Care

## 2016-07-08 DIAGNOSIS — Y9389 Activity, other specified: Secondary | ICD-10-CM | POA: Insufficient documentation

## 2016-07-08 DIAGNOSIS — Y92410 Unspecified street and highway as the place of occurrence of the external cause: Secondary | ICD-10-CM | POA: Insufficient documentation

## 2016-07-08 DIAGNOSIS — Z87891 Personal history of nicotine dependence: Secondary | ICD-10-CM | POA: Diagnosis not present

## 2016-07-08 DIAGNOSIS — M25512 Pain in left shoulder: Secondary | ICD-10-CM | POA: Diagnosis not present

## 2016-07-08 DIAGNOSIS — Y999 Unspecified external cause status: Secondary | ICD-10-CM

## 2016-07-08 DIAGNOSIS — S161XXA Strain of muscle, fascia and tendon at neck level, initial encounter: Secondary | ICD-10-CM | POA: Diagnosis not present

## 2016-07-08 DIAGNOSIS — I10 Essential (primary) hypertension: Secondary | ICD-10-CM | POA: Diagnosis not present

## 2016-07-08 DIAGNOSIS — Z5321 Procedure and treatment not carried out due to patient leaving prior to being seen by health care provider: Secondary | ICD-10-CM | POA: Insufficient documentation

## 2016-07-08 DIAGNOSIS — M25531 Pain in right wrist: Secondary | ICD-10-CM | POA: Insufficient documentation

## 2016-07-08 DIAGNOSIS — S40012A Contusion of left shoulder, initial encounter: Secondary | ICD-10-CM | POA: Diagnosis not present

## 2016-07-08 DIAGNOSIS — Z79899 Other long term (current) drug therapy: Secondary | ICD-10-CM | POA: Diagnosis not present

## 2016-07-08 DIAGNOSIS — E039 Hypothyroidism, unspecified: Secondary | ICD-10-CM | POA: Diagnosis not present

## 2016-07-08 DIAGNOSIS — J45909 Unspecified asthma, uncomplicated: Secondary | ICD-10-CM | POA: Diagnosis not present

## 2016-07-08 DIAGNOSIS — Y92411 Interstate highway as the place of occurrence of the external cause: Secondary | ICD-10-CM | POA: Diagnosis not present

## 2016-07-08 DIAGNOSIS — S0990XA Unspecified injury of head, initial encounter: Secondary | ICD-10-CM | POA: Diagnosis not present

## 2016-07-08 DIAGNOSIS — S199XXA Unspecified injury of neck, initial encounter: Secondary | ICD-10-CM | POA: Diagnosis present

## 2016-07-08 DIAGNOSIS — S4992XA Unspecified injury of left shoulder and upper arm, initial encounter: Secondary | ICD-10-CM | POA: Diagnosis not present

## 2016-07-08 MED ORDER — OXYCODONE-ACETAMINOPHEN 5-325 MG PO TABS
1.0000 | ORAL_TABLET | ORAL | Status: DC | PRN
  Administered 2016-07-08: 1 via ORAL
  Filled 2016-07-08: qty 1
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
## 2016-07-08 NOTE — ED Notes (Signed)
Patient c/o right wrist, left shoulder and left neck pain. Denies numbness or tingling, no loss of bladder or bowel. A&O x4.

## 2016-07-08 NOTE — ED Notes (Signed)
Pt called from triage with no answer 

## 2016-07-09 ENCOUNTER — Emergency Department (HOSPITAL_COMMUNITY)
Admission: EM | Admit: 2016-07-09 | Discharge: 2016-07-09 | Disposition: A | Discharge status: Home / Self Care | Payer: Medicare Other | Attending: Emergency Medicine | Admitting: Emergency Medicine

## 2016-07-09 ENCOUNTER — Encounter (HOSPITAL_COMMUNITY): Payer: Self-pay | Admitting: Emergency Medicine

## 2016-07-09 ENCOUNTER — Emergency Department (HOSPITAL_COMMUNITY): Payer: Medicare Other

## 2016-07-09 DIAGNOSIS — J45909 Unspecified asthma, uncomplicated: Secondary | ICD-10-CM | POA: Insufficient documentation

## 2016-07-09 DIAGNOSIS — S40012A Contusion of left shoulder, initial encounter: Secondary | ICD-10-CM | POA: Insufficient documentation

## 2016-07-09 DIAGNOSIS — I10 Essential (primary) hypertension: Secondary | ICD-10-CM | POA: Insufficient documentation

## 2016-07-09 DIAGNOSIS — Z87891 Personal history of nicotine dependence: Secondary | ICD-10-CM | POA: Insufficient documentation

## 2016-07-09 DIAGNOSIS — S0990XA Unspecified injury of head, initial encounter: Secondary | ICD-10-CM | POA: Diagnosis not present

## 2016-07-09 DIAGNOSIS — S161XXA Strain of muscle, fascia and tendon at neck level, initial encounter: Secondary | ICD-10-CM

## 2016-07-09 DIAGNOSIS — S4992XA Unspecified injury of left shoulder and upper arm, initial encounter: Secondary | ICD-10-CM | POA: Diagnosis not present

## 2016-07-09 DIAGNOSIS — E039 Hypothyroidism, unspecified: Secondary | ICD-10-CM | POA: Insufficient documentation

## 2016-07-09 DIAGNOSIS — Y999 Unspecified external cause status: Secondary | ICD-10-CM | POA: Insufficient documentation

## 2016-07-09 DIAGNOSIS — Y9389 Activity, other specified: Secondary | ICD-10-CM | POA: Insufficient documentation

## 2016-07-09 DIAGNOSIS — M25512 Pain in left shoulder: Secondary | ICD-10-CM | POA: Diagnosis not present

## 2016-07-09 DIAGNOSIS — Y92411 Interstate highway as the place of occurrence of the external cause: Secondary | ICD-10-CM | POA: Insufficient documentation

## 2016-07-09 DIAGNOSIS — Z79899 Other long term (current) drug therapy: Secondary | ICD-10-CM | POA: Insufficient documentation

## 2016-07-09 MED ORDER — ACETAMINOPHEN 500 MG PO TABS: 1000.0000 mg | ORAL_TABLET | Freq: Four times a day (QID) | ORAL | 0 refills | Status: AC | PRN

## 2016-07-09 MED ORDER — ACETAMINOPHEN 500 MG PO TABS
1000.0000 mg | ORAL_TABLET | Freq: Once | ORAL | Status: AC
  Administered 2016-07-09: 1000 mg via ORAL
  Filled 2016-07-09: qty 2

## 2016-07-09 MED ORDER — TRAMADOL HCL 50 MG PO TABS
100.0000 mg | ORAL_TABLET | Freq: Once | ORAL | Status: AC
  Administered 2016-07-09: 100 mg via ORAL
  Filled 2016-07-09: qty 2

## 2016-07-09 MED ORDER — TRAMADOL HCL 50 MG PO TABS: 100.0000 mg | ORAL_TABLET | Freq: Four times a day (QID) | ORAL | 0 refills | Status: AC | PRN
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
## 2016-07-31 DIAGNOSIS — Z0001 Encounter for general adult medical examination with abnormal findings: Secondary | ICD-10-CM | POA: Diagnosis not present

## 2016-07-31 DIAGNOSIS — I10 Essential (primary) hypertension: Secondary | ICD-10-CM | POA: Diagnosis not present

## 2016-07-31 DIAGNOSIS — M1991 Primary osteoarthritis, unspecified site: Secondary | ICD-10-CM | POA: Diagnosis not present

## 2016-08-28 DIAGNOSIS — Z0001 Encounter for general adult medical examination with abnormal findings: Secondary | ICD-10-CM | POA: Diagnosis not present

## 2016-08-28 DIAGNOSIS — I10 Essential (primary) hypertension: Secondary | ICD-10-CM | POA: Diagnosis not present

## 2016-08-28 DIAGNOSIS — M1991 Primary osteoarthritis, unspecified site: Secondary | ICD-10-CM | POA: Diagnosis not present

## 2016-09-30 DIAGNOSIS — M1991 Primary osteoarthritis, unspecified site: Secondary | ICD-10-CM | POA: Diagnosis not present

## 2016-09-30 DIAGNOSIS — I10 Essential (primary) hypertension: Secondary | ICD-10-CM | POA: Diagnosis not present

## 2016-10-28 DIAGNOSIS — I10 Essential (primary) hypertension: Secondary | ICD-10-CM | POA: Diagnosis not present

## 2016-10-28 DIAGNOSIS — M1991 Primary osteoarthritis, unspecified site: Secondary | ICD-10-CM | POA: Diagnosis not present

## 2016-11-26 DIAGNOSIS — I10 Essential (primary) hypertension: Secondary | ICD-10-CM | POA: Diagnosis not present

## 2016-11-26 DIAGNOSIS — Z0001 Encounter for general adult medical examination with abnormal findings: Secondary | ICD-10-CM | POA: Diagnosis not present

## 2016-11-26 DIAGNOSIS — M1991 Primary osteoarthritis, unspecified site: Secondary | ICD-10-CM | POA: Diagnosis not present

## 2016-12-02 ENCOUNTER — Ambulatory Visit (INDEPENDENT_AMBULATORY_CARE_PROVIDER_SITE_OTHER): Payer: Medicare Other | Admitting: Family

## 2016-12-02 ENCOUNTER — Encounter (INDEPENDENT_AMBULATORY_CARE_PROVIDER_SITE_OTHER): Payer: Self-pay | Admitting: Family

## 2016-12-02 ENCOUNTER — Ambulatory Visit (INDEPENDENT_AMBULATORY_CARE_PROVIDER_SITE_OTHER): Payer: Medicare Other

## 2016-12-02 VITALS — Ht 64.0 in | Wt 212.0 lb

## 2016-12-02 DIAGNOSIS — M1712 Unilateral primary osteoarthritis, left knee: Secondary | ICD-10-CM | POA: Diagnosis not present

## 2016-12-02 DIAGNOSIS — M25562 Pain in left knee: Secondary | ICD-10-CM

## 2016-12-02 DIAGNOSIS — M21062 Valgus deformity, not elsewhere classified, left knee: Secondary | ICD-10-CM

## 2016-12-02 DIAGNOSIS — M7989 Other specified soft tissue disorders: Secondary | ICD-10-CM

## 2016-12-02 NOTE — Progress Notes (Signed)
Office Visit Note   Patient: [name]           Date of Birth: [birth year]           MRN: [id number] Visit Date: 12/02/2016              Requested by: Georgianne FickAjith Ramachandran, MD [address] [place], [place] [id number] PCP: Georgianne FickAMACHANDRAN,AJITH, MD  Chief Complaint  Patient presents with  . Right Knee - Edema  . Left Knee - Edema, Pain    HPI: Patient is a 51 year old woman who is seen today for 2 separate issues.   1. She is complaining of painful swelling to bilateral lower extremities this has been ongoing for quite some time. Wonders if she should be taking a "water pill."   2. Her other complaint is left knee pain. This has been ongoing and for quite some time much worse over the last 3 days. Complains of popping and clicking sounds with range of motion of the knee complains of locking and catching has not yet given way. She reports no falls. She states that she has had prior arthroscopy in 2015 to the left knee. Has been told she will need total knee arthroplasty has some point. States she is not ready to proceed with this at this time.  She is status post laparoscopic sleeve gastrectomy  in 2015. She has lost over 70lbs since last office visit.     Assessment & Plan: Visit Diagnoses:  1. Acute pain of left knee   2. Primary osteoarthritis of left knee   3. Valgus deformity, not elsewhere classified, left knee   4. Swelling of both lower extremities     Plan: Injection for the left knee pain today. She'll follow up in the office in 4 weeks to check on her progress. Have recommended that she begin with compression stockings bilaterally and where these daily. If on her feet often have recommended that she rest and elevate the lower extremities as well.  Follow-Up Instructions: Return in about 4 weeks (around 12/30/2016), or if symptoms worsen or fail to improve.   Ortho Exam Patient is alert and oriented. No adenopathy. Well-dressed. Normal affect.  Respirations easy. Obese. Does have an antalgic gait. Examination of the left knee she is tender over the medial and lateral joint line there is moderate swelling. No appreciable effusion. Stable to varus and valgus stress. Does have crepitation with range of motion. Bilateral lower extremities have pitting edema there is no weeping drainage or open ulceration or sign of infection.   Imaging: No results found.  Orders:  Orders Placed This Encounter  Procedures  . Large Joint Injection/Arthrocentesis  . XR Knee 1-2 Views Left   No orders of the defined types were placed in this encounter.    Procedures: Large Joint Inj Date/Time: 12/03/2016 3:17 PM Performed by: Barnie DelZAMORA, Jaydalee Bardwell R Authorized by: Barnie DelZAMORA, Kiran Lapine R   Consent Given by:  Patient Site marked: the procedure site was marked   Timeout: prior to procedure the correct patient, procedure, and site was verified   Indications:  Pain and diagnostic evaluation Location:  Knee Site:  L knee Needle Size:  22 G Needle Length:  1.5 inches Ultrasound Guidance: No   Fluoroscopic Guidance: No   Arthrogram: No   Medications:  5 mL lidocaine 1 %; 40 mg methylPREDNISolone acetate 40 MG/ML Aspiration Attempted: No   Patient tolerance:  Patient tolerated the procedure well with no immediate complications    Clinical  Data: No additional findings.  Subjective: Review of Systems  Constitutional: Negative for chills and fever.  Respiratory: Negative for shortness of breath.   Cardiovascular: Positive for leg swelling. Negative for chest pain.  Musculoskeletal: Positive for arthralgias, gait problem and joint swelling. Negative for myalgias.    Objective: Vital Signs: Ht 5\' 4"  (1.626 m)   Wt 212 lb (96.2 kg)   LMP 10/21/2015 (Approximate)   BMI 36.39 kg/m   Specialty Comments:  No specialty comments available.  PMFS History: Patient Active Problem List   Diagnosis Date Noted  . Primary osteoarthritis of left knee 12/03/2016  .  Valgus deformity, not elsewhere classified, left knee 12/03/2016  . Status post laparoscopic sleeve gastrectomyAugust 2015 07/04/2014  . Morbid obesity (HCC) 07/04/2014  . Morbidly obese (HCC)   . Asthma in adult   . Obstructive sleep apnea (adult) (pediatric) 04/08/2013   Past Medical History:  Diagnosis Date  . Arthritis   . Asthma in adult    "seasonal"  . GERD (gastroesophageal reflux disease)   . Gout   . Hyperlipidemia   . Hypertension   . Hypothyroidism   . Morbidly obese (HCC)    BMI 55  . Sleep apnea    cpap  . Thyroid disease     Family History  Problem Relation Age of Onset  . Stroke Father   . Hypertension Other   . Coronary artery disease Sister   . Diabetes Brother     Past Surgical History:  Procedure Laterality Date  . BREATH TEK H PYLORI N/A 07/19/2013   Procedure: BREATH TEK H PYLORI;  Surgeon: Lodema Pilot, DO;  Location: [facility];  Service: Endoscopy;  Laterality: N/A;  . CESAREAN SECTION  [phone]  . CHOLECYSTECTOMY  1985  . HAND SURGERY Left 2012  . HAND SURGERY    . KNEE ARTHROSCOPY Left 06/16/2014   Procedure: ARTHROSCOPY KNEE;  Surgeon: Nadara Mustard, MD;  Location: [facility] OR;  Service: Orthopedics;  Laterality: Left;  Left Knee Arthroscopy and Debridement and chrondraplasty  . LAPAROSCOPIC GASTRIC SLEEVE RESECTION N/A 07/04/2014   Procedure: LAPAROSCOPIC GASTRIC SLEEVE RESECTION;  Surgeon: Valarie Merino, MD;  Location: [facility] ORS;  Service: General;  Laterality: N/A;  . shoulder Right 2011  . THYROID SURGERY  2002-2003   Social History   Occupational History  . Not on file.   Social History Main Topics  . Smoking status: Former Smoker    Types: Cigarettes    Quit date: 03/17/2005  . Smokeless tobacco: Never Used  . Alcohol use No  . Drug use: No  . Sexual activity: Not on file

## 2016-12-03 DIAGNOSIS — M1712 Unilateral primary osteoarthritis, left knee: Secondary | ICD-10-CM | POA: Insufficient documentation

## 2016-12-03 DIAGNOSIS — M21062 Valgus deformity, not elsewhere classified, left knee: Secondary | ICD-10-CM | POA: Insufficient documentation

## 2016-12-03 MED ORDER — LIDOCAINE HCL 1 % IJ SOLN
5.0000 mL | INTRAMUSCULAR | Status: AC | PRN
  Administered 2016-12-03: 5 mL

## 2016-12-03 MED ORDER — METHYLPREDNISOLONE ACETATE 40 MG/ML IJ SUSP
40.0000 mg | INTRAMUSCULAR | Status: AC | PRN
  Administered 2016-12-03: 40 mg via INTRA_ARTICULAR

## 2016-12-25 DIAGNOSIS — M1991 Primary osteoarthritis, unspecified site: Secondary | ICD-10-CM | POA: Diagnosis not present

## 2016-12-25 DIAGNOSIS — I2510 Atherosclerotic heart disease of native coronary artery without angina pectoris: Secondary | ICD-10-CM | POA: Diagnosis not present

## 2016-12-25 DIAGNOSIS — I209 Angina pectoris, unspecified: Secondary | ICD-10-CM | POA: Diagnosis not present

## 2016-12-25 DIAGNOSIS — Z0001 Encounter for general adult medical examination with abnormal findings: Secondary | ICD-10-CM | POA: Diagnosis not present

## 2016-12-25 DIAGNOSIS — E7800 Pure hypercholesterolemia, unspecified: Secondary | ICD-10-CM | POA: Diagnosis not present

## 2016-12-25 DIAGNOSIS — I10 Essential (primary) hypertension: Secondary | ICD-10-CM | POA: Diagnosis not present

## 2017-01-22 DIAGNOSIS — M1991 Primary osteoarthritis, unspecified site: Secondary | ICD-10-CM | POA: Diagnosis not present

## 2017-01-22 DIAGNOSIS — I10 Essential (primary) hypertension: Secondary | ICD-10-CM | POA: Diagnosis not present

## 2017-01-22 DIAGNOSIS — Z79891 Long term (current) use of opiate analgesic: Secondary | ICD-10-CM | POA: Diagnosis not present

## 2017-02-02 ENCOUNTER — Encounter (HOSPITAL_COMMUNITY): Payer: Self-pay

## 2017-02-19 DIAGNOSIS — I10 Essential (primary) hypertension: Secondary | ICD-10-CM | POA: Diagnosis not present

## 2017-02-19 DIAGNOSIS — Z79891 Long term (current) use of opiate analgesic: Secondary | ICD-10-CM | POA: Diagnosis not present

## 2017-02-19 DIAGNOSIS — M1991 Primary osteoarthritis, unspecified site: Secondary | ICD-10-CM | POA: Diagnosis not present

## 2017-03-20 DIAGNOSIS — Z79891 Long term (current) use of opiate analgesic: Secondary | ICD-10-CM | POA: Diagnosis not present

## 2017-05-22 DIAGNOSIS — Z79891 Long term (current) use of opiate analgesic: Secondary | ICD-10-CM | POA: Diagnosis not present

## 2017-07-23 DIAGNOSIS — I10 Essential (primary) hypertension: Secondary | ICD-10-CM | POA: Diagnosis not present

## 2017-07-23 DIAGNOSIS — J4520 Mild intermittent asthma, uncomplicated: Secondary | ICD-10-CM | POA: Diagnosis not present

## 2017-07-23 DIAGNOSIS — M1991 Primary osteoarthritis, unspecified site: Secondary | ICD-10-CM | POA: Diagnosis not present

## 2017-08-04 DIAGNOSIS — M25511 Pain in right shoulder: Secondary | ICD-10-CM | POA: Diagnosis not present

## 2017-08-04 DIAGNOSIS — M25512 Pain in left shoulder: Secondary | ICD-10-CM | POA: Diagnosis not present

## 2017-08-04 DIAGNOSIS — Z79899 Other long term (current) drug therapy: Secondary | ICD-10-CM | POA: Diagnosis not present

## 2017-08-04 DIAGNOSIS — M5442 Lumbago with sciatica, left side: Secondary | ICD-10-CM | POA: Diagnosis not present

## 2017-08-04 DIAGNOSIS — M5441 Lumbago with sciatica, right side: Secondary | ICD-10-CM | POA: Diagnosis not present

## 2017-09-03 DIAGNOSIS — M5441 Lumbago with sciatica, right side: Secondary | ICD-10-CM | POA: Diagnosis not present

## 2017-09-03 DIAGNOSIS — M5431 Sciatica, right side: Secondary | ICD-10-CM | POA: Diagnosis not present

## 2017-09-03 DIAGNOSIS — M5442 Lumbago with sciatica, left side: Secondary | ICD-10-CM | POA: Diagnosis not present

## 2017-09-03 DIAGNOSIS — G8929 Other chronic pain: Secondary | ICD-10-CM | POA: Diagnosis not present

## 2017-09-30 DIAGNOSIS — M4316 Spondylolisthesis, lumbar region: Secondary | ICD-10-CM | POA: Diagnosis not present

## 2017-09-30 DIAGNOSIS — M47816 Spondylosis without myelopathy or radiculopathy, lumbar region: Secondary | ICD-10-CM | POA: Diagnosis not present

## 2017-09-30 DIAGNOSIS — M5135 Other intervertebral disc degeneration, thoracolumbar region: Secondary | ICD-10-CM | POA: Diagnosis not present

## 2017-10-02 DIAGNOSIS — M5441 Lumbago with sciatica, right side: Secondary | ICD-10-CM | POA: Diagnosis not present

## 2017-10-02 DIAGNOSIS — M5442 Lumbago with sciatica, left side: Secondary | ICD-10-CM | POA: Diagnosis not present

## 2017-10-02 DIAGNOSIS — M25511 Pain in right shoulder: Secondary | ICD-10-CM | POA: Diagnosis not present

## 2017-10-02 DIAGNOSIS — M25562 Pain in left knee: Secondary | ICD-10-CM | POA: Diagnosis not present

## 2017-10-02 DIAGNOSIS — Z79899 Other long term (current) drug therapy: Secondary | ICD-10-CM | POA: Diagnosis not present

## 2017-10-05 ENCOUNTER — Telehealth (INDEPENDENT_AMBULATORY_CARE_PROVIDER_SITE_OTHER): Payer: Self-pay | Admitting: Radiology

## 2017-10-05 NOTE — Telephone Encounter (Signed)
Patient called into triage, stated fell last night 10/04/17 on left knee. States had previous surgery on knee by Dr. Lajoyce Cornersuda, not recent.  Patient has 1:30pm appt tomorrow with Dr. Lajoyce Cornersuda would like a call if there is a cancellation on specifically Dr. Audrie Liauda's schedule this afternoon or tomorrow morning.  Call back number  [phone]

## 2017-10-05 NOTE — Telephone Encounter (Signed)
No appt slots open

## 2017-10-06 ENCOUNTER — Ambulatory Visit (INDEPENDENT_AMBULATORY_CARE_PROVIDER_SITE_OTHER): Payer: Medicare Other | Admitting: Orthopedic Surgery

## 2017-10-16 DIAGNOSIS — G8929 Other chronic pain: Secondary | ICD-10-CM | POA: Diagnosis not present

## 2017-10-16 DIAGNOSIS — M1990 Unspecified osteoarthritis, unspecified site: Secondary | ICD-10-CM | POA: Diagnosis not present

## 2017-10-16 DIAGNOSIS — M25512 Pain in left shoulder: Secondary | ICD-10-CM | POA: Diagnosis not present

## 2017-10-16 DIAGNOSIS — Z79899 Other long term (current) drug therapy: Secondary | ICD-10-CM | POA: Diagnosis not present

## 2017-10-16 DIAGNOSIS — M25511 Pain in right shoulder: Secondary | ICD-10-CM | POA: Diagnosis not present

## 2017-10-21 DIAGNOSIS — Z79891 Long term (current) use of opiate analgesic: Secondary | ICD-10-CM | POA: Diagnosis not present

## 2017-11-04 DIAGNOSIS — M5441 Lumbago with sciatica, right side: Secondary | ICD-10-CM | POA: Diagnosis not present

## 2017-11-04 DIAGNOSIS — M1990 Unspecified osteoarthritis, unspecified site: Secondary | ICD-10-CM | POA: Diagnosis not present

## 2017-11-04 DIAGNOSIS — M5442 Lumbago with sciatica, left side: Secondary | ICD-10-CM | POA: Diagnosis not present

## 2017-11-04 DIAGNOSIS — G8929 Other chronic pain: Secondary | ICD-10-CM | POA: Diagnosis not present

## 2017-11-04 DIAGNOSIS — Z79899 Other long term (current) drug therapy: Secondary | ICD-10-CM | POA: Diagnosis not present

## 2017-12-15 ENCOUNTER — Encounter (INDEPENDENT_AMBULATORY_CARE_PROVIDER_SITE_OTHER): Payer: Self-pay | Admitting: Orthopaedic Surgery

## 2017-12-15 ENCOUNTER — Ambulatory Visit (INDEPENDENT_AMBULATORY_CARE_PROVIDER_SITE_OTHER): Payer: Medicare Other | Admitting: Orthopaedic Surgery

## 2017-12-15 DIAGNOSIS — M1712 Unilateral primary osteoarthritis, left knee: Secondary | ICD-10-CM | POA: Diagnosis not present

## 2017-12-15 MED ORDER — BUPIVACAINE HCL 0.25 % IJ SOLN
2.0000 mL | INTRAMUSCULAR | Status: AC | PRN
  Administered 2017-12-15: 2 mL via INTRA_ARTICULAR

## 2017-12-15 MED ORDER — METHYLPREDNISOLONE ACETATE 40 MG/ML IJ SUSP
40.0000 mg | INTRAMUSCULAR | Status: AC | PRN
  Administered 2017-12-15: 40 mg via INTRA_ARTICULAR

## 2017-12-15 MED ORDER — LIDOCAINE HCL 1 % IJ SOLN
2.0000 mL | INTRAMUSCULAR | Status: AC | PRN
  Administered 2017-12-15: 2 mL

## 2017-12-15 NOTE — Progress Notes (Signed)
Office Visit Note   Patient: [name]           Date of Birth: Oct 17, 1966           MRN: [id number] Visit Date: 12/15/2017              Requested by: Georgianne Fickamachandran, Ajith, MD [address] [place], [place] [id number] PCP: Georgianne Fickamachandran, Ajith, MD   Assessment & Plan: Visit Diagnoses:  1. Primary osteoarthritis of left knee     Plan: Impression is primary localized osteoarthritis left knee.  At this point Suzanne SagoSarah is getting close to being ready for a total knee arthroplasty.  She would like to try one more cortisone injection to hold her until the weather gets warmer.  She will then follow-up with us to schedule her left total knee arthroplasty.  Follow-Up Instructions: Return if symptoms worsen or fail to improve.   Orders:  Orders Placed This Encounter  Procedures  . Large Joint Inj   No orders of the defined types were placed in this encounter.     Procedures: Large Joint Inj: L knee on 12/15/2017 1:46 PM Indications: pain Details: 22 G needle, anterolateral approach Medications: 2 mL lidocaine 1 %; 2 mL bupivacaine 0.25 %; 40 mg methylPREDNISolone acetate 40 MG/ML      Clinical Data: No additional findings.   Subjective: Chief Complaint  Patient presents with  . Left Knee - Pain    HPI Suzanne SagoSarah is a pleasant 52 year old female who presents our clinic today with recurrent left knee pain.  History of primary localized osteoarthritis.  She was seen by Dr. due to and Clifton CustardAaron approximately 1 year ago where she was injected with cortisone.  This significantly helped until recently.  The pain she is currently having is located to the lateral aspect of her knee.  She describes this as constant in nature.  She does get associated locking catching as well as instability.  She has rest pain and night pain.  She is tried Tylenol with minimal relief of symptoms.  Of note she is status post left knee arthroscopic debridement back in 2015 by Dr.Duda. Acute  Review  of Systems As detailed in HPI.  All others reviewed and are negative.   Objective: Vital Signs: LMP 10/21/2015 (Approximate)   Physical Exam well-developed well-nourished female no acute distress.  Alert and oriented x3.  Ortho Exam examination of her left knee reveals valgus deformity.  Partially correctable.  Lateral joint line tenderness.  Moderate patellofemoral crepitus.  She is neurovascular intact distally.  Specialty Comments:  No specialty comments available.  Imaging: No results found.   PMFS History: Patient Active Problem List   Diagnosis Date Noted  . Primary osteoarthritis of left knee 12/03/2016  . Valgus deformity, not elsewhere classified, left knee 12/03/2016  . Status post laparoscopic sleeve gastrectomyAugust 2015 07/04/2014  . Morbid obesity (HCC) 07/04/2014  . Morbidly obese (HCC)   . Asthma in adult   . Obstructive sleep apnea (adult) (pediatric) 04/08/2013   Past Medical History:  Diagnosis Date  . Arthritis   . Asthma in adult    "seasonal"  . GERD (gastroesophageal reflux disease)   . Gout   . Hyperlipidemia   . Hypertension   . Hypothyroidism   . Morbidly obese (HCC)    BMI 55  . Sleep apnea    cpap  . Thyroid disease     Family History  Problem Relation Age of Onset  . Stroke Father   .  Hypertension Other   . Coronary artery disease Sister   . Diabetes Brother     Past Surgical History:  Procedure Laterality Date  . BREATH TEK H PYLORI N/A 07/19/2013   Procedure: BREATH TEK H PYLORI;  Surgeon: Lodema Pilot, DO;  Location: [facility];  Service: Endoscopy;  Laterality: N/A;  . CESAREAN SECTION  [phone]  . CHOLECYSTECTOMY  1985  . HAND SURGERY Left 2012  . HAND SURGERY    . KNEE ARTHROSCOPY Left 06/16/2014   Procedure: ARTHROSCOPY KNEE;  Surgeon: Nadara Mustard, MD;  Location: [facility];  Service: Orthopedics;  Laterality: Left;  Left Knee Arthroscopy and Debridement and chrondraplasty  . LAPAROSCOPIC GASTRIC SLEEVE RESECTION N/A  07/04/2014   Procedure: LAPAROSCOPIC GASTRIC SLEEVE RESECTION;  Surgeon: Valarie Merino, MD;  Location: [facility];  Service: General;  Laterality: N/A;  . shoulder Right 2011  . THYROID SURGERY  2002-2003   Social History   Occupational History  . Not on file  Tobacco Use  . Smoking status: Former Smoker    Types: Cigarettes    Last attempt to quit: 03/17/2005    Years since quitting: 12.7  . Smokeless tobacco: Never Used  Substance and Sexual Activity  . Alcohol use: No  . Drug use: No  . Sexual activity: Not on file

## 2017-12-16 ENCOUNTER — Ambulatory Visit (INDEPENDENT_AMBULATORY_CARE_PROVIDER_SITE_OTHER): Payer: Medicare Other | Admitting: Family

## 2018-01-19 DIAGNOSIS — I10 Essential (primary) hypertension: Secondary | ICD-10-CM | POA: Diagnosis not present

## 2018-01-19 DIAGNOSIS — J45909 Unspecified asthma, uncomplicated: Secondary | ICD-10-CM | POA: Diagnosis not present

## 2018-01-19 DIAGNOSIS — K219 Gastro-esophageal reflux disease without esophagitis: Secondary | ICD-10-CM | POA: Diagnosis not present

## 2018-01-19 DIAGNOSIS — I2510 Atherosclerotic heart disease of native coronary artery without angina pectoris: Secondary | ICD-10-CM | POA: Diagnosis not present

## 2018-01-20 DIAGNOSIS — M1990 Unspecified osteoarthritis, unspecified site: Secondary | ICD-10-CM | POA: Diagnosis not present

## 2018-01-26 ENCOUNTER — Encounter: Payer: Medicare Other | Admitting: Family Medicine

## 2018-01-26 ENCOUNTER — Encounter: Payer: Self-pay | Admitting: Family Medicine

## 2018-01-26 DIAGNOSIS — G8929 Other chronic pain: Secondary | ICD-10-CM | POA: Diagnosis not present

## 2018-01-26 DIAGNOSIS — M549 Dorsalgia, unspecified: Secondary | ICD-10-CM | POA: Diagnosis not present

## 2018-01-26 DIAGNOSIS — M25562 Pain in left knee: Secondary | ICD-10-CM | POA: Diagnosis not present

## 2018-01-26 DIAGNOSIS — Z760 Encounter for issue of repeat prescription: Secondary | ICD-10-CM | POA: Diagnosis not present

## 2018-01-26 NOTE — Progress Notes (Signed)
Pt was initially here to establish care, however was unaware of this provider's opioid policies.  Pt was informed that this provider would not be able to refill her numerous narcotic prescriptions including MS Contin, liquid morphine, oxycodone, and Opana.  Pt was formerly seen by Dr. Thea SilversmithMackenzie, whose office was abruptly closed.  Pt was offered a referral for pain management but was advised this may take 4 weeks before an appointment is given.  Pt was also advised that pain management may not give her pain meds on that first visit.  Pt decided to try other offices to establish care.  Patient did not establish care this visit.  Pt is still seeking a PCP.  Abbe AmsterdamShannon Banks, MD

## 2018-02-01 DIAGNOSIS — M4726 Other spondylosis with radiculopathy, lumbar region: Secondary | ICD-10-CM | POA: Diagnosis not present

## 2018-02-01 DIAGNOSIS — E039 Hypothyroidism, unspecified: Secondary | ICD-10-CM | POA: Diagnosis not present

## 2018-02-01 DIAGNOSIS — Z9884 Bariatric surgery status: Secondary | ICD-10-CM | POA: Diagnosis not present

## 2018-02-01 DIAGNOSIS — M4696 Unspecified inflammatory spondylopathy, lumbar region: Secondary | ICD-10-CM | POA: Diagnosis not present

## 2018-02-01 DIAGNOSIS — M2550 Pain in unspecified joint: Secondary | ICD-10-CM | POA: Diagnosis not present

## 2018-02-01 DIAGNOSIS — Z760 Encounter for issue of repeat prescription: Secondary | ICD-10-CM | POA: Diagnosis not present

## 2018-02-01 DIAGNOSIS — M109 Gout, unspecified: Secondary | ICD-10-CM | POA: Diagnosis not present

## 2018-07-06 ENCOUNTER — Ambulatory Visit (INDEPENDENT_AMBULATORY_CARE_PROVIDER_SITE_OTHER): Payer: Self-pay | Admitting: Physician Assistant

## 2018-09-08 ENCOUNTER — Ambulatory Visit (INDEPENDENT_AMBULATORY_CARE_PROVIDER_SITE_OTHER): Payer: Medicare Other | Admitting: Family

## 2018-09-14 ENCOUNTER — Ambulatory Visit (INDEPENDENT_AMBULATORY_CARE_PROVIDER_SITE_OTHER): Payer: Medicare Other | Admitting: Orthopedic Surgery

## 2019-05-12 ENCOUNTER — Ambulatory Visit: Payer: Medicare Other | Admitting: Orthopaedic Surgery

## 2019-05-13 ENCOUNTER — Ambulatory Visit: Payer: Medicare Other | Admitting: Orthopaedic Surgery

## 2019-05-17 ENCOUNTER — Ambulatory Visit (INDEPENDENT_AMBULATORY_CARE_PROVIDER_SITE_OTHER): Payer: Medicare Other | Admitting: Orthopaedic Surgery

## 2019-05-17 ENCOUNTER — Ambulatory Visit (INDEPENDENT_AMBULATORY_CARE_PROVIDER_SITE_OTHER): Payer: Medicare Other

## 2019-05-17 ENCOUNTER — Encounter: Payer: Self-pay | Admitting: Orthopaedic Surgery

## 2019-05-17 ENCOUNTER — Other Ambulatory Visit: Payer: Self-pay

## 2019-05-17 DIAGNOSIS — M25562 Pain in left knee: Secondary | ICD-10-CM

## 2019-05-17 DIAGNOSIS — M21062 Valgus deformity, not elsewhere classified, left knee: Secondary | ICD-10-CM | POA: Diagnosis not present

## 2019-05-17 DIAGNOSIS — M1712 Unilateral primary osteoarthritis, left knee: Secondary | ICD-10-CM | POA: Diagnosis not present

## 2019-05-17 MED ORDER — METHYLPREDNISOLONE ACETATE 40 MG/ML IJ SUSP
40.0000 mg | INTRAMUSCULAR | Status: AC | PRN
  Administered 2019-05-17: 40 mg via INTRA_ARTICULAR

## 2019-05-17 MED ORDER — LIDOCAINE HCL 1 % IJ SOLN
2.0000 mL | INTRAMUSCULAR | Status: AC | PRN
  Administered 2019-05-17: 11:00:00 2 mL

## 2019-05-17 MED ORDER — BUPIVACAINE HCL 0.5 % IJ SOLN
2.0000 mL | INTRAMUSCULAR | Status: AC | PRN
  Administered 2019-05-17: 11:00:00 2 mL via INTRA_ARTICULAR

## 2019-05-17 NOTE — Addendum Note (Signed)
Addended by: Minda Ditto, Alyse Low N on: 05/17/2019 12:04 PM   Modules accepted: Orders

## 2019-05-17 NOTE — Progress Notes (Signed)
Office Visit Note   Patient: [name]           Date of Birth: Feb 27, 1966           MRN: [id number] Visit Date: 05/17/2019              Requested by: Deeann SaintBanks, Shannon R, MD [address] [place],  [place] [id number] PCP: Deeann SaintBanks, Shannon R, MD   Assessment & Plan: Visit Diagnoses:  1. Primary osteoarthritis of left knee   2. Left knee pain, unspecified chronicity   3. Valgus deformity, not elsewhere classified, left knee     Plan: Impression is end-stage left knee degenerative joint disease and valgus deformity.  We had a long discussion about treatment options and a reasonable likelihood of each option providing significant and long-lasting relief.  The best bet would be to undergo a total knee replacement in order to achieve improve quality of life and improve function and ambulation.  She will take time to think about this.  Today we performed aspiration and injection of her left knee and yielded 45 cc of fluid.  Patient will be in touch with us when she is ready for surgery.  Her BMI is slightly above 40 given how bad her knee is I think it be very difficult for her to lose anymore weight.    Follow-Up Instructions: Return if symptoms worsen or fail to improve.   Orders:  Orders Placed This Encounter  Procedures  . XR Knee Complete 4 Views Left  . Cell count + diff,  w/ cryst-synvl fld   No orders of the defined types were placed in this encounter.     Procedures: Large Joint Inj: L knee on 05/17/2019 11:14 AM Details: 22 G needle Medications: 2 mL bupivacaine 0.5 %; 2 mL lidocaine 1 %; 40 mg methylPREDNISolone acetate 40 MG/ML Aspirate: 45 mL yellow; sent for lab analysis Outcome: tolerated well, no immediate complications Patient was prepped and draped in the usual sterile fashion.       Clinical Data: No additional findings.   Subjective: No chief complaint on file.   [name] is a very pleasant 53 year old female comes in today for evaluation of  her left knee pain.  We have previously seen her about a year and a half ago for left knee pain.  She states that she has severe difficulty with ambulation due to the fact that the knee does not need and she has severe pain.  She has now started to fall as a result.  She walks with a cane.  She is about 5 years status post left knee arthroscopy with chondroplasty.  She is also status post gastric sleeve procedure which helped her lose a tremendous amount of weight.   Review of Systems  Constitutional: Negative.   HENT: Negative.   Eyes: Negative.   Respiratory: Negative.   Cardiovascular: Negative.   Endocrine: Negative.   Musculoskeletal: Negative.   Neurological: Negative.   Hematological: Negative.   Psychiatric/Behavioral: Negative.   All other systems reviewed and are negative.    Objective: Vital Signs: LMP 10/21/2015 (Approximate)   Physical Exam Vitals signs and nursing note reviewed.  Constitutional:      Appearance: She is well-developed.  Pulmonary:     Effort: Pulmonary effort is normal.  Skin:    General: Skin is warm.     Capillary Refill: Capillary refill takes less than 2 seconds.  Neurological:     Mental Status: She is alert and  oriented to person, place, and time.  Psychiatric:        Behavior: Behavior normal.        Thought Content: Thought content normal.        Judgment: Judgment normal.     Ortho Exam Left knee exam shows a fixed valgus deformity.  No flexion contracture.  Neurovascular intact distally.  Large joint effusion. Specialty Comments:  No specialty comments available.  Imaging: Xr Knee Complete 4 Views Left  Result Date: 05/17/2019 Severe tricompartmental degenerative joint disease worse in the lateral compartment with valgus deformity    PMFS History: Patient Active Problem List   Diagnosis Date Noted  . Primary osteoarthritis of left knee 12/03/2016  . Valgus deformity, not elsewhere classified, left knee 12/03/2016  .  Status post laparoscopic sleeve gastrectomyAugust 2015 07/04/2014  . Morbid obesity ([place]) 07/04/2014  . Morbidly obese ([place])   . Asthma in adult   . Obstructive sleep apnea (adult) (pediatric) 04/08/2013   Past Medical History:  Diagnosis Date  . Arthritis   . Asthma in adult    "seasonal"  . GERD (gastroesophageal reflux disease)   . Gout   . Hyperlipidemia   . Hypertension   . Hypothyroidism   . Morbidly obese (HCC)    BMI 55  . Sleep apnea    cpap  . Thyroid disease     Family History  Problem Relation Age of Onset  . Stroke Father   . Hypertension Other   . Coronary artery disease Sister   . Diabetes Brother     Past Surgical History:  Procedure Laterality Date  . BREATH TEK H PYLORI N/A 07/19/2013   Procedure: BREATH TEK H PYLORI;  Surgeon: Madilyn Hook, DO;  Location: [facility];  Service: Endoscopy;  Laterality: N/A;  . CESAREAN SECTION  [phone]  . CHOLECYSTECTOMY  1985  . HAND SURGERY Left 2012  . HAND SURGERY    . KNEE ARTHROSCOPY Left 06/16/2014   Procedure: ARTHROSCOPY KNEE;  Surgeon: Newt Minion, MD;  Location: [facility];  Service: Orthopedics;  Laterality: Left;  Left Knee Arthroscopy and Debridement and chrondraplasty  . LAPAROSCOPIC GASTRIC SLEEVE RESECTION N/A 07/04/2014   Procedure: LAPAROSCOPIC GASTRIC SLEEVE RESECTION;  Surgeon: Pedro Earls, MD;  Location: [facility];  Service: General;  Laterality: N/A;  . shoulder Right 2011  . THYROID SURGERY  2002-2003   Social History   Occupational History  . Not on file  Tobacco Use  . Smoking status: Former Smoker    Types: Cigarettes    Quit date: 03/17/2005    Years since quitting: 14.1  . Smokeless tobacco: Never Used  Substance and Sexual Activity  . Alcohol use: No  . Drug use: No  . Sexual activity: Not on file

## 2019-05-18 LAB — SYNOVIAL CELL COUNT + DIFF, W/ CRYSTALS
Basophils, %: 0 %
Eosinophils-Synovial: 0 % (ref 0–2)
Lymphocytes-Synovial Fld: 65 % (ref 0–74)
Monocyte/Macrophage: 21 % (ref 0–69)
Neutrophil, Synovial: 14 % (ref 0–24)
Synoviocytes, %: 0 % (ref 0–15)
WBC, Synovial: 470 cells/uL — ABNORMAL HIGH (ref <150)

## 2019-06-01 ENCOUNTER — Telehealth: Payer: Self-pay | Admitting: Orthopaedic Surgery

## 2019-06-01 ENCOUNTER — Other Ambulatory Visit: Payer: Self-pay | Admitting: Radiology

## 2019-06-01 DIAGNOSIS — M1712 Unilateral primary osteoarthritis, left knee: Secondary | ICD-10-CM

## 2019-06-01 DIAGNOSIS — M21062 Valgus deformity, not elsewhere classified, left knee: Secondary | ICD-10-CM

## 2019-06-01 DIAGNOSIS — G8929 Other chronic pain: Secondary | ICD-10-CM

## 2019-06-01 NOTE — Telephone Encounter (Signed)
Patient called concerning the referral for her left knee.  Dr plummer at [address] [place] [address] The number to contact patient is [phone]

## 2019-06-01 NOTE — Telephone Encounter (Signed)
Called patient and Dr. Earnie Larsson office [phone]) Placed new referral and faxed to [phone]  Patient aware new referral has been faxed, Dr. Mirna Mires and referral coordination will review, 2-3week review process, facility is currently scheduling into August, patient will be given call by facility to schedule if approved, if not we will refer her to different location.

## 2020-01-27 ENCOUNTER — Emergency Department (HOSPITAL_COMMUNITY): Payer: Medicare Other

## 2020-01-27 ENCOUNTER — Emergency Department (HOSPITAL_COMMUNITY)
Admission: EM | Admit: 2020-01-27 | Discharge: 2020-01-27 | Disposition: A | Discharge status: Home / Self Care | Payer: Medicare Other | Attending: Emergency Medicine | Admitting: Emergency Medicine

## 2020-01-27 DIAGNOSIS — K219 Gastro-esophageal reflux disease without esophagitis: Secondary | ICD-10-CM | POA: Diagnosis not present

## 2020-01-27 DIAGNOSIS — Z79899 Other long term (current) drug therapy: Secondary | ICD-10-CM | POA: Diagnosis not present

## 2020-01-27 DIAGNOSIS — R1084 Generalized abdominal pain: Secondary | ICD-10-CM | POA: Insufficient documentation

## 2020-01-27 DIAGNOSIS — R110 Nausea: Secondary | ICD-10-CM | POA: Insufficient documentation

## 2020-01-27 DIAGNOSIS — I10 Essential (primary) hypertension: Secondary | ICD-10-CM | POA: Diagnosis not present

## 2020-01-27 LAB — CBC WITH DIFFERENTIAL/PLATELET
Abs Immature Granulocytes: 0.06 10*3/uL (ref 0.00–0.07)
Basophils Absolute: 0 10*3/uL (ref 0.0–0.1)
Basophils Relative: 1 %
Eosinophils Absolute: 0.1 10*3/uL (ref 0.0–0.5)
Eosinophils Relative: 2 %
HCT: 46.4 % — ABNORMAL HIGH (ref 36.0–46.0)
Hemoglobin: 14.7 g/dL (ref 12.0–15.0)
Immature Granulocytes: 1 %
Lymphocytes Relative: 16 %
Lymphs Abs: 1.2 10*3/uL (ref 0.7–4.0)
MCH: 32 pg (ref 26.0–34.0)
MCHC: 31.7 g/dL (ref 30.0–36.0)
MCV: 101.1 fL — ABNORMAL HIGH (ref 80.0–100.0)
Monocytes Absolute: 0.8 10*3/uL (ref 0.1–1.0)
Monocytes Relative: 11 %
Neutro Abs: 5.1 10*3/uL (ref 1.7–7.7)
Neutrophils Relative %: 69 %
Platelets: 359 10*3/uL (ref 150–400)
RBC: 4.59 MIL/uL (ref 3.87–5.11)
RDW: 13.4 % (ref 11.5–15.5)
WBC: 7.3 10*3/uL (ref 4.0–10.5)
nRBC: 0 % (ref 0.0–0.2)

## 2020-01-27 LAB — RAPID URINE DRUG SCREEN, HOSP PERFORMED
Amphetamines: NOT DETECTED
Barbiturates: NOT DETECTED
Benzodiazepines: NOT DETECTED
Cocaine: POSITIVE — AB
Opiates: POSITIVE — AB
Tetrahydrocannabinol: NOT DETECTED

## 2020-01-27 LAB — COMPREHENSIVE METABOLIC PANEL
ALT: 9 U/L (ref 0–44)
AST: 15 U/L (ref 15–41)
Albumin: 4.2 g/dL (ref 3.5–5.0)
Alkaline Phosphatase: 81 U/L (ref 38–126)
Anion gap: 12 (ref 5–15)
BUN: 6 mg/dL (ref 6–20)
CO2: 21 mmol/L — ABNORMAL LOW (ref 22–32)
Calcium: 9.5 mg/dL (ref 8.9–10.3)
Chloride: 105 mmol/L (ref 98–111)
Creatinine, Ser: 0.64 mg/dL (ref 0.44–1.00)
GFR calc Af Amer: >60 mL/min (ref >60)
GFR calc non Af Amer: >60 mL/min (ref >60)
Glucose, Bld: 123 mg/dL — ABNORMAL HIGH (ref 70–99)
Potassium: 3.6 mmol/L (ref 3.5–5.1)
Sodium: 138 mmol/L (ref 135–145)
Total Bilirubin: 1 mg/dL (ref 0.3–1.2)
Total Protein: 7.5 g/dL (ref 6.5–8.1)

## 2020-01-27 LAB — URINALYSIS, ROUTINE W REFLEX MICROSCOPIC
Bilirubin Urine: NEGATIVE
Glucose, UA: NEGATIVE mg/dL
Ketones, ur: NEGATIVE mg/dL
Nitrite: NEGATIVE
Protein, ur: NEGATIVE mg/dL
Specific Gravity, Urine: 1.008 (ref 1.005–1.030)
pH: 9 — ABNORMAL HIGH (ref 5.0–8.0)

## 2020-01-27 LAB — CBG MONITORING, ED: Glucose-Capillary: 89 mg/dL (ref 70–99)

## 2020-01-27 LAB — LIPASE, BLOOD: Lipase: 19 U/L (ref 11–51)

## 2020-01-27 MED ORDER — DIPHENHYDRAMINE HCL 50 MG/ML IJ SOLN
50.0000 mg | Freq: Once | INTRAMUSCULAR | Status: AC
  Administered 2020-01-27: 50 mg via INTRAVENOUS
  Filled 2020-01-27: qty 1

## 2020-01-27 MED ORDER — HALOPERIDOL LACTATE 5 MG/ML IJ SOLN
5.0000 mg | Freq: Once | INTRAMUSCULAR | Status: AC
  Administered 2020-01-27: 5 mg via INTRAVENOUS
  Filled 2020-01-27: qty 1

## 2020-01-27 MED ORDER — IOHEXOL 300 MG/ML  SOLN
100.0000 mL | Freq: Once | INTRAMUSCULAR | Status: AC | PRN
  Administered 2020-01-27: 100 mL via INTRAVENOUS

## 2020-01-27 NOTE — ED Triage Notes (Signed)
Pt presents with EMS with c/o abdominal ain x 1 episode of emesis.  Admits to snorting suboxone this AM, but no other drug use.  Pt states she used it to relieve her abd pain.  Pt poor historian and has rapid body movements like wiggling and flopping and rocking.  Denies any other drugs or ETOH use.

## 2020-01-27 NOTE — ED Provider Notes (Signed)
Pt's care assumed at 4pm by me.  Ct scan pending.  Pt's  Ct scan shows hernia but no obstruction.  Pt reevaluated.  Pt feels better.   Pt advised to follow up with her MD for recheck    [name] 01/27/20 2018    Tilden Fossa, MD 01/28/20 0025

## 2020-01-27 NOTE — ED Notes (Signed)
Patient transported to CT 

## 2020-01-27 NOTE — ED Notes (Signed)
Pt CBG was 89, notified Brenda(RN)

## 2020-01-27 NOTE — Discharge Instructions (Addendum)
Follow up with your Physician for recheck.  Return if any problems.  

## 2020-01-27 NOTE — ED Provider Notes (Signed)
[facility] EMERGENCY DEPARTMENT Provider Note   CSN: [id number] Arrival date & time: 01/27/20  1254     History No chief complaint on file.   [name] is a 54 y.o. female.  The history is provided by the patient. No language interpreter was used.     54 year old female with history of obesity, asthma, hypertension, thyroid disease, brought here via EMS for evaluation of abdominal pain.  Patient report diffuse abdominal pain that started today.  She reports snorting some Suboxone today as well.  She is unable to tell me if her pain started before after starting the Suboxone.  Pain is 10 out of 10, nothing seem to make it better or worse.  Patient was found to be rocking in the bed trying to alleviate her symptoms.  Mild nausea but denies any vomiting or diarrhea constipation.  No report of chest pain shortness of breath or cough.  Patient is a poor historian, history limited.  Denies any marijuana use.  Denies any other drug use.  Past Medical History:  Diagnosis Date  . Arthritis   . Asthma in adult    "seasonal"  . GERD (gastroesophageal reflux disease)   . Gout   . Hyperlipidemia   . Hypertension   . Hypothyroidism   . Morbidly obese (HCC)    BMI 55  . Sleep apnea    cpap  . Thyroid disease     Patient Active Problem List   Diagnosis Date Noted  . Primary osteoarthritis of left knee 12/03/2016  . Valgus deformity, not elsewhere classified, left knee 12/03/2016  . Status post laparoscopic sleeve gastrectomyAugust 2015 07/04/2014  . Morbid obesity (HCC) 07/04/2014  . Morbidly obese (HCC)   . Asthma in adult   . Obstructive sleep apnea (adult) (pediatric) 04/08/2013    Past Surgical History:  Procedure Laterality Date  . BREATH TEK H PYLORI N/A 07/19/2013   Procedure: BREATH TEK H PYLORI;  Surgeon: Lodema Pilot, DO;  Location: [facility];  Service: Endoscopy;  Laterality: N/A;  . CESAREAN SECTION  [phone]  . CHOLECYSTECTOMY  1985  . HAND  SURGERY Left 2012  . HAND SURGERY    . KNEE ARTHROSCOPY Left 06/16/2014   Procedure: ARTHROSCOPY KNEE;  Surgeon: Nadara Mustard, MD;  [facility];  Service: Orthopedics;  Laterality: Left;  Left Knee Arthroscopy and Debridement and chrondraplasty  . LAPAROSCOPIC GASTRIC SLEEVE RESECTION N/A 07/04/2014   Procedure: LAPAROSCOPIC GASTRIC SLEEVE RESECTION;  Surgeon: Valarie Merino, MD;  Location: [facility];  Service: General;  Laterality: N/A;  . shoulder Right 2011  . THYROID SURGERY  2002-2003     OB History   No obstetric history on file.     Family History  Problem Relation Age of Onset  . Stroke Father   . Hypertension Other   . Coronary artery disease Sister   . Diabetes Brother     Social History   Tobacco Use  . Smoking status: Former Smoker    Types: Cigarettes    Quit date: 03/17/2005    Years since quitting: 14.8  . Smokeless tobacco: Never Used  Substance Use Topics  . Alcohol use: No  . Drug use: No    Home Medications Prior to Admission medications   Medication Sig Start Date End Date Taking? Authorizing Provider  acetaminophen (TYLENOL) 500 MG tablet Take 2 tablets (1,000 mg total) by mouth every 6 (six) hours as needed. Patient not taking: Reported on 01/26/2018 07/09/16  Arby Barrette, MD  AFLURIA QUADRIVALENT 0.5 ML injection  11/26/16   [provider]  AMITIZA 24 MCG capsule  10/31/16   [provider]  amLODipine (NORVASC) 10 MG tablet Take 10 mg by mouth every morning.  02/21/13   [provider]  aspirin EC 81 MG tablet Take 81 mg by mouth every morning.    [provider]  atenolol (TENORMIN) 25 MG tablet Take 25 mg by mouth every morning.     [provider]  colchicine 0.6 MG tablet Take 0.6 mg by mouth 2 (two) times daily as needed. [place] 06/07/14   [provider]  CRESTOR 40 MG tablet Take 40 mg by mouth every morning.  03/01/13   [provider]  DEXILANT 30 MG capsule Take 30 mg by  mouth daily.  05/13/14   [provider]  fluticasone (FLONASE) 50 MCG/ACT nasal spray Place 1 spray into the nose daily as needed for allergies.  05/04/14   [provider]  HYDROcodone-acetaminophen (HYCET) 7.5-325 mg/15 ml solution Take 15 mLs by mouth every 4 (four) hours as needed for moderate pain. Patient not taking: Reported on 01/26/2018 06/29/14   Luretha Murphy, MD  levothyroxine (SYNTHROID, LEVOTHROID) 175 MCG tablet Take 175 mcg by mouth daily before breakfast.     [provider]  montelukast (SINGULAIR) 10 MG tablet  11/20/16   [provider]  morphine (MS CONTIN) 30 MG 12 hr tablet  11/26/16   [provider]  nitroGLYCERIN (NITROSTAT) 0.4 MG SL tablet Place 0.4 mg under the tongue every 5 (five) minutes as needed for chest pain.    [provider]  oxycodone (ROXICODONE) 30 MG immediate release tablet Take 30 mg by mouth every 8 (eight) hours as needed for pain.  05/20/14   [provider]  oxyCODONE-acetaminophen (PERCOCET/ROXICET) 5-325 MG tablet Take 2 tablets by mouth every 4 (four) hours as needed for severe pain. Patient not taking: Reported on 01/26/2018 01/12/16   Anselm Pancoast, PA-C  oxyCODONE-acetaminophen (ROXICET) 5-325 MG per tablet Take 1 tablet by mouth every 4 (four) hours as needed for severe pain. Patient not taking: Reported on 01/26/2018 06/16/14   Nadara Mustard, MD  oxymorphone [facility]) 40 MG 12 hr tablet Take 40 mg by mouth 2 (two) times daily as needed for pain.    [provider]  PENNSAID 2 % SOLN Apply 1 application topically 2 (two) times daily as needed (pain).  05/18/14   [provider]  PNEUMOVAX 23 25 MCG/0.5ML injection  11/26/16   [provider]  traMADol (ULTRAM) 50 MG tablet Take 2 tablets (100 mg total) by mouth every 6 (six) hours as needed. Patient not taking: Reported on 01/26/2018 07/09/16   Arby Barrette, MD    Allergies    Ibuprofen, Lyrica [pregabalin],  Dilaudid [hydromorphone hcl], and Tape  Review of Systems   Review of Systems  Unable to perform ROS: Other    Physical Exam Updated Vital Signs Ht 5\' 4"  (1.626 m)   Wt (!) 0.907 kg   LMP 10/21/2015 (Approximate)   BMI 0.34 kg/m   Physical Exam Vitals and nursing note reviewed.  Constitutional:      Appearance: She is well-developed. She is obese.     Comments: Obese female, rolling around in the bed, rocking, appears uncomfortable complaining of abdominal pain.  HENT:     Head: Atraumatic.  Eyes:     Conjunctiva/sclera: Conjunctivae normal.  Cardiovascular:  Rate and Rhythm: Normal rate and regular rhythm.     Pulses: Normal pulses.     Heart sounds: Normal heart sounds.  Pulmonary:     Effort: Pulmonary effort is normal.     Breath sounds: Normal breath sounds.  Abdominal:     Palpations: Abdomen is soft.     Tenderness: There is abdominal tenderness (Diffuse tenderness on palpation without focal point tenderness.).  Musculoskeletal:     Cervical back: Neck supple.  Skin:    Findings: No rash.  Neurological:     Mental Status: She is alert.     ED Results / Procedures / Treatments   Labs (all labs ordered are listed, but only abnormal results are displayed) Labs Reviewed  CBC WITH DIFFERENTIAL/PLATELET - Abnormal; Notable for the following components:      Result Value   HCT 46.4 (*)    MCV 101.1 (*)    All other components within normal limits  URINALYSIS, ROUTINE W REFLEX MICROSCOPIC - Abnormal; Notable for the following components:   Color, Urine STRAW (*)    pH 9.0 (*)    Hgb urine dipstick SMALL (*)    Leukocytes,Ua TRACE (*)    Bacteria, UA RARE (*)    All other components within normal limits  RAPID URINE DRUG SCREEN, HOSP PERFORMED  COMPREHENSIVE METABOLIC PANEL  LIPASE, BLOOD  CBG MONITORING, ED    EKG EKG Interpretation  Date/Time:  Friday January 27 2020 13:37:39 EST Ventricular Rate:  70 PR Interval:  172 QRS Duration: 92 QT  Interval:  400 QTC Calculation: 432 R Axis:   -47 Text Interpretation: Normal sinus rhythm Left anterior fascicular block Nonspecific T wave abnormality Abnormal ECG when compared to prior, no significant changes seen. no STEMI Confirmed by Antony Blackbird [phone]) on 01/27/2020 1:43:25 PM   Radiology No results found.  Procedures Procedures (including critical care time)  Medications Ordered in ED Medications  diphenhydrAMINE (BENADRYL) injection 50 mg (50 mg Intravenous Given 01/27/20 1334)  haloperidol lactate (HALDOL) injection 5 mg (5 mg Intravenous Given 01/27/20 1407)    ED Course  I have reviewed the triage vital signs and the nursing notes.  Pertinent labs & imaging results that were available during my care of the patient were reviewed by me and considered in my medical decision making (see chart for details).    MDM Rules/Calculators/A&P                      BP (!) 127/112 (BP Location: Right Arm)   Pulse 65   Resp 18   Ht 5\' 4"  (1.626 m)   Wt (!) 0.907 kg   LMP 10/21/2015 (Approximate)   SpO2 98%   BMI 0.34 kg/m   Final Clinical Impression(s) / ED Diagnoses Final diagnoses:  None    Rx / DC Orders ED Discharge Orders    None     1:30 PM Patient here complaining of abdominal pain after snorting Suboxone earlier today.  She appears uncomfortable, rolling around in bed thrashing about.  Exam is limited as patient is currently in the hallway.  Work appreciated.  3:25 PM Patient presents with symptoms suggestive of potential cannabinoid hyperemesis syndrome.  She also has what appears to be a abdominal wall hernia that does not appear to be incarcerated or strangulated.  However given her abdominal discomfort, will obtain abdominal pelvic CT scan for further evaluation.  Patient was given Haldol and Benadryl for her symptoms.  She did appear more  comfortable after receiving treatment.  Patient signed out to oncoming provider who will follow up on CT scan and  determine disposition.   Fayrene Helper, PA-C 01/27/20 1531    Tegeler, Canary Brim, MD 01/27/20 [phone]

## 2020-01-27 NOTE — ED Notes (Signed)
CT notified that labs were back.

## 2020-08-23 ENCOUNTER — Emergency Department (HOSPITAL_COMMUNITY)
Admission: EM | Admit: 2020-08-23 | Discharge: 2020-08-23 | Disposition: A | Discharge status: Home / Self Care | Payer: Medicare Other | Attending: Emergency Medicine | Admitting: Emergency Medicine

## 2020-08-23 ENCOUNTER — Other Ambulatory Visit: Payer: Self-pay

## 2020-08-23 DIAGNOSIS — T50901A Poisoning by unspecified drugs, medicaments and biological substances, accidental (unintentional), initial encounter: Secondary | ICD-10-CM | POA: Diagnosis present

## 2020-08-23 DIAGNOSIS — M25569 Pain in unspecified knee: Secondary | ICD-10-CM | POA: Diagnosis not present

## 2020-08-23 DIAGNOSIS — R410 Disorientation, unspecified: Secondary | ICD-10-CM | POA: Diagnosis not present

## 2020-08-23 LAB — CBC WITH DIFFERENTIAL/PLATELET
Abs Immature Granulocytes: 0.03 10*3/uL (ref 0.00–0.07)
Basophils Absolute: 0 10*3/uL (ref 0.0–0.1)
Basophils Relative: 0 %
Eosinophils Absolute: 0.1 10*3/uL (ref 0.0–0.5)
Eosinophils Relative: 2 %
HCT: 43.4 % (ref 36.0–46.0)
Hemoglobin: 13.6 g/dL (ref 12.0–15.0)
Immature Granulocytes: 1 %
Lymphocytes Relative: 22 %
Lymphs Abs: 1.2 10*3/uL (ref 0.7–4.0)
MCH: 31.2 pg (ref 26.0–34.0)
MCHC: 31.3 g/dL (ref 30.0–36.0)
MCV: 99.5 fL (ref 80.0–100.0)
Monocytes Absolute: 0.6 10*3/uL (ref 0.1–1.0)
Monocytes Relative: 11 %
Neutro Abs: 3.5 10*3/uL (ref 1.7–7.7)
Neutrophils Relative %: 64 %
Platelets: 296 10*3/uL (ref 150–400)
RBC: 4.36 MIL/uL (ref 3.87–5.11)
RDW: 13.1 % (ref 11.5–15.5)
WBC: 5.5 10*3/uL (ref 4.0–10.5)
nRBC: 0 % (ref 0.0–0.2)

## 2020-08-23 LAB — COMPREHENSIVE METABOLIC PANEL
ALT: 8 U/L (ref 0–44)
AST: 15 U/L (ref 15–41)
Albumin: 3.9 g/dL (ref 3.5–5.0)
Alkaline Phosphatase: 81 U/L (ref 38–126)
Anion gap: 8 (ref 5–15)
BUN: 12 mg/dL (ref 6–20)
CO2: 27 mmol/L (ref 22–32)
Calcium: 9.4 mg/dL (ref 8.9–10.3)
Chloride: 102 mmol/L (ref 98–111)
Creatinine, Ser: 0.68 mg/dL (ref 0.44–1.00)
GFR calc non Af Amer: >60 mL/min (ref >60)
Glucose, Bld: 106 mg/dL — ABNORMAL HIGH (ref 70–99)
Potassium: 3.3 mmol/L — ABNORMAL LOW (ref 3.5–5.1)
Sodium: 137 mmol/L (ref 135–145)
Total Bilirubin: 0.2 mg/dL — ABNORMAL LOW (ref 0.3–1.2)
Total Protein: 7.3 g/dL (ref 6.5–8.1)

## 2020-08-23 LAB — ETHANOL: Alcohol, Ethyl (B): <10 mg/dL (ref <10)

## 2020-08-23 LAB — ACETAMINOPHEN LEVEL: Acetaminophen (Tylenol), Serum: <10 ug/mL — ABNORMAL LOW (ref 10–30)

## 2020-08-23 LAB — SALICYLATE LEVEL: Salicylate Lvl: <7 mg/dL — ABNORMAL LOW (ref 7.0–30.0)

## 2020-08-23 NOTE — ED Triage Notes (Signed)
BIB [facility], pt was found in the car not in park at a gas station. Pt admits to taking prescribed oxycodone and drinking alcohol. Pt denies SI/HI. Pupils were constricted upon EMS arrival. A&Ox 4 at this time.

## 2020-08-23 NOTE — ED Notes (Signed)
[name] was called by Burna Mortimer. Pt ambulatory from bed to wheelchair.

## 2020-08-23 NOTE — ED Notes (Signed)
Per Dr. Silverio Lay request RN contacted CM/SW for transport home after being unable to contact family

## 2020-08-23 NOTE — Discharge Instructions (Signed)
Please do not take too much of your medicines.  Avoid drinking alcohol while taking your narcotics  Do not drink and drive.  See your doctor for follow-up  Return to ER if you are lethargic, passing out.

## 2020-08-23 NOTE — ED Notes (Signed)
Patient is alert and oriented x3. Dr. Silverio Lay want to hold off on IV and labs at this time. Vital signs stable. Pt to call to see if they can get a ride home per MD.

## 2020-08-23 NOTE — ED Provider Notes (Signed)
[facility] EMERGENCY DEPARTMENT Provider Note   CSN: [id number] Arrival date & time: 08/23/20  1705     History Chief Complaint  Patient presents with  . Drug Overdose    [name] is a 54 y.o. female who presented with drug overdose.  Patient states that she has worsening knee pain so took more of her oxycodone.  She was found on the street altered with pinpoint pupils.  Patient also drank some alcohol as well.  Patient adamantly denies any thoughts to harm herself.  EMS was called and she was brought here for evaluation.  The history is provided by the patient.       No past medical history on file.  There are no problems to display for this patient.     OB History   No obstetric history on file.     No family history on file.  Social History   Tobacco Use  . Smoking status: Not on file  Substance Use Topics  . Alcohol use: Not on file  . Drug use: Not on file    Home Medications Prior to Admission medications   Not on File    Allergies    Patient has no allergy information on record.  Review of Systems   Review of Systems  Psychiatric/Behavioral: Positive for confusion.  All other systems reviewed and are negative.   Physical Exam Updated Vital Signs BP (!) 171/94 (BP Location: Right Arm)   Pulse 67   Temp 97.9 F (36.6 C) (Oral)   Resp 16   SpO2 99%   Physical Exam Vitals and nursing note reviewed.  Constitutional:      Comments: Slightly sleepy but awake and alert now  HENT:     Head: Normocephalic and atraumatic.     Comments: No obvious scalp hematoma    Nose: Nose normal.     Mouth/Throat:     Mouth: Mucous membranes are moist.  Eyes:     Extraocular Movements: Extraocular movements intact.     Pupils: Pupils are equal, round, and reactive to light.  Cardiovascular:     Rate and Rhythm: Normal rate and regular rhythm.     Pulses: Normal pulses.     Heart sounds: Normal heart sounds.  Pulmonary:      Effort: Pulmonary effort is normal.     Breath sounds: Normal breath sounds.  Abdominal:     General: Abdomen is flat.     Palpations: Abdomen is soft.  Musculoskeletal:        General: Normal range of motion.     Cervical back: Normal range of motion.  Skin:    General: Skin is warm.     Capillary Refill: Capillary refill takes less than 2 seconds.  Neurological:     General: No focal deficit present.     Mental Status: She is alert and oriented to person, place, and time.     Cranial Nerves: No cranial nerve deficit.     Sensory: No sensory deficit.     Motor: No weakness.  Psychiatric:        Mood and Affect: Mood normal.        Behavior: Behavior normal.     ED Results / Procedures / Treatments   Labs (all labs ordered are listed, but only abnormal results are displayed) Labs Reviewed - No data to display  EKG EKG Interpretation  Date/Time:  Thursday August 23 2020 17:17:58 EDT Ventricular Rate:  78 PR Interval:  QRS Duration: 104 QT Interval:  403 QTC Calculation: 459 R Axis:   -43 Text Interpretation: Sinus rhythm Left axis deviation No previous ECGs available Confirmed by Richardean Canal [phone]) on 08/23/2020 5:20:41 PM   Radiology No results found.  Procedures Procedures (including critical care time)  Medications Ordered in ED Medications - No data to display  ED Course  I have reviewed the triage vital signs and the nursing notes.  Pertinent labs & imaging results that were available during my care of the patient were reviewed by me and considered in my medical decision making (see chart for details).    MDM Rules/Calculators/A&P                         [name] is a 54 y.o. female here presenting with confusion and possible overdose.  She admits to taking too much of her pain medicine.  She states that she also drank some alcohol.  I do not see any obvious track marks.  Patient adamantly denies suicidal ideation or suicidal attempt.  At this  point her mental status is back to baseline.  I offered labs for further evaluation but she states that she wants to just go home.  She states that she will have somebody come and pick her up.    8:20 PM Patient eventually was agreeable to labs. Her serum tox and chemistry were unremarkable.  Patient is more awake now.  UDS pending but she admits to taking some of her oxycodone and adamantly denies any suicidal attempt.  At this point, patient is stable for discharge.   Final Clinical Impression(s) / ED Diagnoses Final diagnoses:  None    Rx / DC Orders ED Discharge Orders    None       Charlynne Pander, MD 08/23/20 2021

## 2020-08-23 NOTE — ED Notes (Signed)
Patient verbalizes understanding of discharge instructions. Opportunity for questioning and answers were provided. Armband removed by staff, pt discharged from ED via wheelchair to [name].

## 2020-08-23 NOTE — ED Notes (Signed)
Pt given gingerale. Pt tolerating po fluids.

## 2021-06-28 ENCOUNTER — Other Ambulatory Visit: Payer: Self-pay | Admitting: Specialist

## 2021-06-28 DIAGNOSIS — Z1231 Encounter for screening mammogram for malignant neoplasm of breast: Secondary | ICD-10-CM

## 2021-07-18 ENCOUNTER — Ambulatory Visit: Payer: Medicare Other

## 2021-08-26 ENCOUNTER — Ambulatory Visit: Payer: Medicare Other

## 2021-09-14 ENCOUNTER — Inpatient Hospital Stay: Admission: RE | Admit: 2021-09-14 | Payer: Medicare Other | Source: Ambulatory Visit

## 2021-10-18 ENCOUNTER — Ambulatory Visit: Payer: Medicare Other

## 2021-10-31 ENCOUNTER — Ambulatory Visit
Admission: RE | Admit: 2021-10-31 | Discharge: 2021-10-31 | Disposition: A | Discharge status: Home / Self Care | Payer: Medicare Other | Source: Ambulatory Visit | Attending: Specialist | Admitting: Specialist

## 2021-10-31 DIAGNOSIS — Z1231 Encounter for screening mammogram for malignant neoplasm of breast: Secondary | ICD-10-CM | POA: Diagnosis not present

## 2021-11-05 ENCOUNTER — Other Ambulatory Visit: Payer: Self-pay | Admitting: Specialist

## 2021-11-05 DIAGNOSIS — R928 Other abnormal and inconclusive findings on diagnostic imaging of breast: Secondary | ICD-10-CM

## 2021-12-09 DIAGNOSIS — R69 Illness, unspecified: Secondary | ICD-10-CM | POA: Diagnosis not present

## 2021-12-09 DIAGNOSIS — M1990 Unspecified osteoarthritis, unspecified site: Secondary | ICD-10-CM | POA: Diagnosis not present

## 2021-12-09 DIAGNOSIS — I10 Essential (primary) hypertension: Secondary | ICD-10-CM | POA: Diagnosis not present

## 2021-12-09 DIAGNOSIS — M7582 Other shoulder lesions, left shoulder: Secondary | ICD-10-CM | POA: Diagnosis not present

## 2021-12-09 DIAGNOSIS — R7303 Prediabetes: Secondary | ICD-10-CM | POA: Diagnosis not present

## 2021-12-17 ENCOUNTER — Other Ambulatory Visit: Payer: Self-pay

## 2021-12-17 ENCOUNTER — Emergency Department (HOSPITAL_COMMUNITY)
Admission: EM | Admit: 2021-12-17 | Discharge: 2021-12-17 | Disposition: A | Discharge status: Home / Self Care | Payer: Medicare Other | Attending: Emergency Medicine | Admitting: Emergency Medicine

## 2021-12-17 ENCOUNTER — Encounter (HOSPITAL_COMMUNITY): Payer: Self-pay

## 2021-12-17 ENCOUNTER — Emergency Department (HOSPITAL_COMMUNITY): Payer: Medicare Other

## 2021-12-17 DIAGNOSIS — I517 Cardiomegaly: Secondary | ICD-10-CM | POA: Diagnosis not present

## 2021-12-17 DIAGNOSIS — Z79899 Other long term (current) drug therapy: Secondary | ICD-10-CM | POA: Diagnosis not present

## 2021-12-17 DIAGNOSIS — R404 Transient alteration of awareness: Secondary | ICD-10-CM | POA: Diagnosis not present

## 2021-12-17 DIAGNOSIS — R0602 Shortness of breath: Secondary | ICD-10-CM | POA: Diagnosis not present

## 2021-12-17 DIAGNOSIS — R0902 Hypoxemia: Secondary | ICD-10-CM | POA: Diagnosis not present

## 2021-12-17 DIAGNOSIS — T50904A Poisoning by unspecified drugs, medicaments and biological substances, undetermined, initial encounter: Secondary | ICD-10-CM | POA: Diagnosis not present

## 2021-12-17 DIAGNOSIS — T401X1A Poisoning by heroin, accidental (unintentional), initial encounter: Secondary | ICD-10-CM | POA: Diagnosis not present

## 2021-12-17 DIAGNOSIS — I10 Essential (primary) hypertension: Secondary | ICD-10-CM | POA: Diagnosis not present

## 2021-12-17 DIAGNOSIS — T887XXA Unspecified adverse effect of drug or medicament, initial encounter: Secondary | ICD-10-CM | POA: Diagnosis not present

## 2021-12-17 LAB — CBC WITH DIFFERENTIAL/PLATELET
Abs Immature Granulocytes: 0.09 10*3/uL — ABNORMAL HIGH (ref 0.00–0.07)
Basophils Absolute: 0 10*3/uL (ref 0.0–0.1)
Basophils Relative: 0 %
Eosinophils Absolute: 0 10*3/uL (ref 0.0–0.5)
Eosinophils Relative: 0 %
HCT: 46.6 % — ABNORMAL HIGH (ref 36.0–46.0)
Hemoglobin: 14.7 g/dL (ref 12.0–15.0)
Immature Granulocytes: 1 %
Lymphocytes Relative: 16 %
Lymphs Abs: 1.1 10*3/uL (ref 0.7–4.0)
MCH: 31.5 pg (ref 26.0–34.0)
MCHC: 31.5 g/dL (ref 30.0–36.0)
MCV: 99.8 fL (ref 80.0–100.0)
Monocytes Absolute: 0.6 10*3/uL (ref 0.1–1.0)
Monocytes Relative: 9 %
Neutro Abs: 4.8 10*3/uL (ref 1.7–7.7)
Neutrophils Relative %: 74 %
Platelets: 333 10*3/uL (ref 150–400)
RBC: 4.67 MIL/uL (ref 3.87–5.11)
RDW: 13.1 % (ref 11.5–15.5)
WBC: 6.5 10*3/uL (ref 4.0–10.5)
nRBC: 0 % (ref 0.0–0.2)

## 2021-12-17 LAB — BASIC METABOLIC PANEL
Anion gap: 7 (ref 5–15)
BUN: 8 mg/dL (ref 6–20)
CO2: 25 mmol/L (ref 22–32)
Calcium: 10.1 mg/dL (ref 8.9–10.3)
Chloride: 107 mmol/L (ref 98–111)
Creatinine, Ser: 0.9 mg/dL (ref 0.44–1.00)
GFR, Estimated: >60 mL/min (ref >60)
Glucose, Bld: 153 mg/dL — ABNORMAL HIGH (ref 70–99)
Potassium: 4.1 mmol/L (ref 3.5–5.1)
Sodium: 139 mmol/L (ref 135–145)

## 2021-12-17 NOTE — Discharge Instructions (Signed)
Return for any problem.  Do not use heroin as it could result in your death.

## 2021-12-17 NOTE — ED Triage Notes (Addendum)
Pt arrived via [facility] from home. Per EMS the pt's husband called. Pt was found on bathroom floor. Per EMS narcan 0.5mg  IV was given about 12-15 mins prior to arrival. Pt has chills and nausea from getting narcan. Pt is A&Ox4. Pt is a little hypertensive. Pt is maintaining airway at this point. Pt admitted to snorting heroin.

## 2021-12-17 NOTE — ED Provider Notes (Signed)
[facility] EMERGENCY DEPARTMENT Provider Note   CSN: [id number] Arrival date & time: 12/17/21  1555     History  Chief Complaint  Patient presents with   Drug Overdose    [name] is a 56 y.o. female.  56 year old female with prior medical history as detailed below presents for evaluation.  Patient reports that she smoked some heroin.  She became unresponsive.  EMS was called.  Her mental status improved with administration of intravenous Narcan.  Upon arrival to the ED she was with minimal complaint.  She reports that she feels cold.  The history is provided by the patient and medical records.  Drug Overdose This is a new problem. The current episode started less than 1 hour ago. The problem occurs rarely. The problem has been rapidly improving. Pertinent negatives include no chest pain, no abdominal pain, no headaches and no shortness of breath. Nothing aggravates the symptoms. Nothing relieves the symptoms.      Home Medications Prior to Admission medications   Medication Sig Start Date End Date Taking? Authorizing Provider  acetaminophen (TYLENOL) 500 MG tablet Take 2 tablets (1,000 mg total) by mouth every 6 (six) hours as needed. 07/09/16   Arby Barrette, MD  AFLURIA QUADRIVALENT 0.5 ML injection  11/26/16   [provider]  AMITIZA 24 MCG capsule  10/31/16   [provider]  amLODipine (NORVASC) 10 MG tablet Take 10 mg by mouth every morning.  02/21/13   [provider]  aspirin EC 81 MG tablet Take 81 mg by mouth every morning.    [provider]  atenolol (TENORMIN) 25 MG tablet Take 25 mg by mouth every morning.     [provider]  colchicine 0.6 MG tablet Take 0.6 mg by mouth 2 (two) times daily as needed. [place] 06/07/14   [provider]  CRESTOR 40 MG tablet Take 40 mg by mouth every morning.  03/01/13   [provider]  DEXILANT 30 MG capsule Take 30 mg by mouth daily.  05/13/14    [provider]  fluticasone (FLONASE) 50 MCG/ACT nasal spray Place 1 spray into the nose daily as needed for allergies.  05/04/14   [provider]  HYDROcodone-acetaminophen (HYCET) 7.5-325 mg/15 ml solution Take 15 mLs by mouth every 4 (four) hours as needed for moderate pain. 06/29/14   Luretha Murphy, MD  levothyroxine (SYNTHROID, LEVOTHROID) 175 MCG tablet Take 175 mcg by mouth daily before breakfast.     [provider]  montelukast (SINGULAIR) 10 MG tablet  11/20/16   [provider]  morphine (MS CONTIN) 30 MG 12 hr tablet  11/26/16   [provider]  nitroGLYCERIN (NITROSTAT) 0.4 MG SL tablet Place 0.4 mg under the tongue every 5 (five) minutes as needed for chest pain.    [provider]  oxycodone (ROXICODONE) 30 MG immediate release tablet Take 30 mg by mouth every 8 (eight) hours as needed for pain.  05/20/14   [provider]  oxyCODONE-acetaminophen (PERCOCET/ROXICET) 5-325 MG tablet Take 2 tablets by mouth every 4 (four) hours as needed for severe pain. 01/12/16   Joy, Shawn C, PA-C  oxyCODONE-acetaminophen (ROXICET) 5-325 MG per tablet Take 1 tablet by mouth every 4 (four) hours as needed for severe pain. 06/16/14   Nadara Mustard, MD  oxymorphone (OPANA ER) 40 MG 12 hr tablet Take 40 mg by mouth 2 (two) times daily as needed for pain.    [provider]  PENNSAID 2 % SOLN Apply 1 application topically 2 (two) times daily as needed (pain).  05/18/14   [provider]  PNEUMOVAX 23 25 MCG/0.5ML injection  11/26/16   [provider]  traMADol (ULTRAM) 50 MG tablet Take 2 tablets (100 mg total) by mouth every 6 (six) hours as needed. 07/09/16   Arby BarrettePfeiffer, Marcy, MD      Allergies    Ibuprofen, Lyrica [pregabalin], Dilaudid [hydromorphone hcl], and Tape    Review of Systems   Review of Systems  Respiratory:  Negative for shortness of breath.   Cardiovascular:  Negative for chest pain.  Gastrointestinal:   Negative for abdominal pain.  Neurological:  Negative for headaches.  All other systems reviewed and are negative.  Physical Exam Updated Vital Signs BP 132/74    Pulse 68    Temp 97.7 F (36.5 C) (Oral)    Resp 16    Ht 5\' 4"  (1.626 m)    Wt 89.8 kg    LMP 10/21/2015 (Approximate)    SpO2 97%    BMI 33.99 kg/m  Physical Exam Vitals and nursing note reviewed.  Constitutional:      General: She is not in acute distress.    Appearance: Normal appearance. She is well-developed.  HENT:     Head: Normocephalic and atraumatic.  Eyes:     Conjunctiva/sclera: Conjunctivae normal.     Pupils: Pupils are equal, round, and reactive to light.  Cardiovascular:     Rate and Rhythm: Normal rate and regular rhythm.     Heart sounds: Normal heart sounds.  Pulmonary:     Effort: Pulmonary effort is normal. No respiratory distress.     Breath sounds: Normal breath sounds.  Abdominal:     General: There is no distension.     Palpations: Abdomen is soft.     Tenderness: There is no abdominal tenderness.  Musculoskeletal:        General: No deformity. Normal range of motion.     Cervical back: Normal range of motion and neck supple.  Skin:    General: Skin is warm and dry.  Neurological:     General: No focal deficit present.     Mental Status: She is alert and oriented to person, place, and time.    ED Results / Procedures / Treatments   Labs (all labs ordered are listed, but only abnormal results are displayed) Labs Reviewed  BASIC METABOLIC PANEL - Abnormal; Notable for the following components:      Result Value   Glucose, Bld 153 (*)    All other components within normal limits  CBC WITH DIFFERENTIAL/PLATELET - Abnormal; Notable for the following components:   HCT 46.6 (*)    Abs Immature Granulocytes 0.09 (*)    All other components within normal limits    EKG None  Radiology DG Chest Port 1 View  Result Date: 12/17/2021 CLINICAL DATA:  Shortness of breath. Found on  bathroom floor. Patient admitted to heroin. Treated with Narcan. EXAM: PORTABLE CHEST 1 VIEW COMPARISON:  06/14/2014 FINDINGS: Cardiac silhouette is again mildly to moderately enlarged. Mediastinal contours are within normal limits. The lungs are clear. No pleural effusion or pneumothorax. Mild dextrocurvature of the mid to upper thoracic spine. IMPRESSION: No active cardiopulmonary disease. Electronically Signed   By: Neita Garnetonald  Viola M.D.   On: 12/17/2021 16:56    Procedures Procedures    Medications Ordered in ED Medications - No data to display  ED Course/ Medical Decision Making/ A&P  Medical Decision Making Amount and/or Complexity of Data Reviewed Labs: ordered. Radiology: ordered.    Medical Screen Complete  This patient presented to the ED with complaint of overdose, heroin overdose.  This complaint involves an extensive number of treatment options. The initial differential diagnosis includes, but is not limited to, polysubstance overdose  This presentation is: Acute, Uncertain Prognosis, Complicated, Systemic Symptoms, and Threat to Life/Bodily Function  Patient presents after heroin overdose.  Patient required Narcan in the field.  Upon arrival she is asymptomatic.    Patient observed without issue in the ED.  Patient is appropriate for discharge.  She does understand need to avoid using illicit drugs in the future.  Co morbidities that complicated the patient's evaluation  History of polysubstance abuse   Additional history obtained:  Additional history obtained from EMS External records from outside sources obtained and reviewed including prior ED visits and prior Inpatient records.    Lab Tests:  I ordered and personally interpreted labs.  The pertinent results include: CBC, BMP   Imaging Studies ordered:  I ordered imaging studies including chest x-ray I independently visualized and interpreted obtained imaging which showed  no acute disease I agree with the radiologist interpretation.   Cardiac Monitoring:  The patient was maintained on a cardiac monitor.  I personally viewed and interpreted the cardiac monitor which showed an underlying rhythm of: NSR      Problem List / ED Course:  Narcotic overdose   Reevaluation:  After the interventions noted above, I reevaluated the patient and found that they have: improved    Disposition:  After consideration of the diagnostic results and the patients response to treatment, I feel that the patent would benefit from close outpatient follow-up.          Final Clinical Impression(s) / ED Diagnoses Final diagnoses:  Accidental overdose of heroin, initial encounter [facility])    Rx / DC Orders ED Discharge Orders     None         Wynetta Fines, MD 12/17/21 [phone]

## 2021-12-25 ENCOUNTER — Ambulatory Visit
Admission: RE | Admit: 2021-12-25 | Discharge: 2021-12-25 | Disposition: A | Discharge status: Home / Self Care | Payer: Medicare Other | Source: Ambulatory Visit | Attending: Specialist | Admitting: Specialist

## 2021-12-25 ENCOUNTER — Other Ambulatory Visit: Payer: Self-pay | Admitting: Specialist

## 2021-12-25 ENCOUNTER — Other Ambulatory Visit: Payer: Self-pay

## 2021-12-25 DIAGNOSIS — R928 Other abnormal and inconclusive findings on diagnostic imaging of breast: Secondary | ICD-10-CM

## 2021-12-25 DIAGNOSIS — N6320 Unspecified lump in the left breast, unspecified quadrant: Secondary | ICD-10-CM

## 2021-12-25 DIAGNOSIS — R922 Inconclusive mammogram: Secondary | ICD-10-CM | POA: Diagnosis not present

## 2021-12-25 DIAGNOSIS — N6321 Unspecified lump in the left breast, upper outer quadrant: Secondary | ICD-10-CM | POA: Diagnosis not present

## 2022-01-03 ENCOUNTER — Other Ambulatory Visit: Payer: Medicare Other

## 2022-01-07 DIAGNOSIS — H5213 Myopia, bilateral: Secondary | ICD-10-CM | POA: Diagnosis not present

## 2022-01-09 ENCOUNTER — Other Ambulatory Visit: Payer: Medicare Other

## 2022-01-10 DIAGNOSIS — I10 Essential (primary) hypertension: Secondary | ICD-10-CM | POA: Diagnosis not present

## 2022-02-03 DIAGNOSIS — H524 Presbyopia: Secondary | ICD-10-CM | POA: Diagnosis not present

## 2022-02-10 DIAGNOSIS — I10 Essential (primary) hypertension: Secondary | ICD-10-CM | POA: Diagnosis not present

## 2022-02-24 DIAGNOSIS — I10 Essential (primary) hypertension: Secondary | ICD-10-CM | POA: Diagnosis not present

## 2022-03-05 DIAGNOSIS — I10 Essential (primary) hypertension: Secondary | ICD-10-CM | POA: Diagnosis not present

## 2022-03-06 ENCOUNTER — Ambulatory Visit
Admission: RE | Admit: 2022-03-06 | Discharge: 2022-03-06 | Disposition: A | Discharge status: Home / Self Care | Payer: Medicare Other | Source: Ambulatory Visit | Attending: Specialist | Admitting: Specialist

## 2022-03-06 ENCOUNTER — Other Ambulatory Visit: Payer: Self-pay | Admitting: Specialist

## 2022-03-06 DIAGNOSIS — N6320 Unspecified lump in the left breast, unspecified quadrant: Secondary | ICD-10-CM

## 2022-03-06 DIAGNOSIS — N6321 Unspecified lump in the left breast, upper outer quadrant: Secondary | ICD-10-CM | POA: Diagnosis not present

## 2022-03-06 DIAGNOSIS — N6042 Mammary duct ectasia of left breast: Secondary | ICD-10-CM | POA: Diagnosis not present

## 2022-03-14 DIAGNOSIS — E039 Hypothyroidism, unspecified: Secondary | ICD-10-CM | POA: Diagnosis not present

## 2022-04-08 DIAGNOSIS — I10 Essential (primary) hypertension: Secondary | ICD-10-CM | POA: Diagnosis not present

## 2022-05-02 DIAGNOSIS — E039 Hypothyroidism, unspecified: Secondary | ICD-10-CM | POA: Diagnosis not present

## 2022-05-02 DIAGNOSIS — I10 Essential (primary) hypertension: Secondary | ICD-10-CM | POA: Diagnosis not present

## 2022-05-09 DIAGNOSIS — I10 Essential (primary) hypertension: Secondary | ICD-10-CM | POA: Diagnosis not present

## 2022-06-27 DIAGNOSIS — K219 Gastro-esophageal reflux disease without esophagitis: Secondary | ICD-10-CM | POA: Diagnosis not present

## 2022-06-27 DIAGNOSIS — I10 Essential (primary) hypertension: Secondary | ICD-10-CM | POA: Diagnosis not present

## 2022-06-27 DIAGNOSIS — K5903 Drug induced constipation: Secondary | ICD-10-CM | POA: Diagnosis not present

## 2022-06-27 DIAGNOSIS — E785 Hyperlipidemia, unspecified: Secondary | ICD-10-CM | POA: Diagnosis not present

## 2022-06-27 DIAGNOSIS — I2584 Coronary atherosclerosis due to calcified coronary lesion: Secondary | ICD-10-CM | POA: Diagnosis not present

## 2022-06-27 DIAGNOSIS — M75101 Unspecified rotator cuff tear or rupture of right shoulder, not specified as traumatic: Secondary | ICD-10-CM | POA: Diagnosis not present

## 2022-06-27 DIAGNOSIS — M170 Bilateral primary osteoarthritis of knee: Secondary | ICD-10-CM | POA: Diagnosis not present

## 2022-06-27 DIAGNOSIS — E039 Hypothyroidism, unspecified: Secondary | ICD-10-CM | POA: Diagnosis not present

## 2022-07-28 DIAGNOSIS — M75101 Unspecified rotator cuff tear or rupture of right shoulder, not specified as traumatic: Secondary | ICD-10-CM | POA: Diagnosis not present

## 2022-07-28 DIAGNOSIS — E785 Hyperlipidemia, unspecified: Secondary | ICD-10-CM | POA: Diagnosis not present

## 2022-07-28 DIAGNOSIS — I10 Essential (primary) hypertension: Secondary | ICD-10-CM | POA: Diagnosis not present

## 2022-07-28 DIAGNOSIS — G4733 Obstructive sleep apnea (adult) (pediatric): Secondary | ICD-10-CM | POA: Diagnosis not present

## 2022-07-28 DIAGNOSIS — M170 Bilateral primary osteoarthritis of knee: Secondary | ICD-10-CM | POA: Diagnosis not present

## 2022-10-29 ENCOUNTER — Telehealth: Payer: Self-pay | Admitting: Orthopedic Surgery

## 2022-10-29 NOTE — Telephone Encounter (Signed)
Received vm from [place] w/ [facility] in [place], requesting medical records. IC,lmvm. Stating to [facility] and leave fax number and I will fax records, ph [phone]

## 2022-10-29 NOTE — Telephone Encounter (Signed)
[name] w/ [facility] called back with fax number. 06/16/2014 OP note and most recent ov note with xray report faxed [phone], ph [phone]

## 2023-04-18 IMAGING — MG MM DIGITAL SCREENING BILAT W/ TOMO AND CAD
8 of 15 series · 8 of 40 positions shown · non-contrast
Comparison: Previous exam(s).

CLINICAL DATA: Screening.

EXAM:
DIGITAL SCREENING BILATERAL MAMMOGRAM WITH TOMOSYNTHESIS AND CAD
TECHNIQUE: Bilateral screening digital craniocaudal and mediolateral oblique
mammograms were obtained. Bilateral screening digital breast
tomosynthesis was performed. The images were evaluated with
computer-aided detection.

[R MLO synth-2D (1 of 2)]
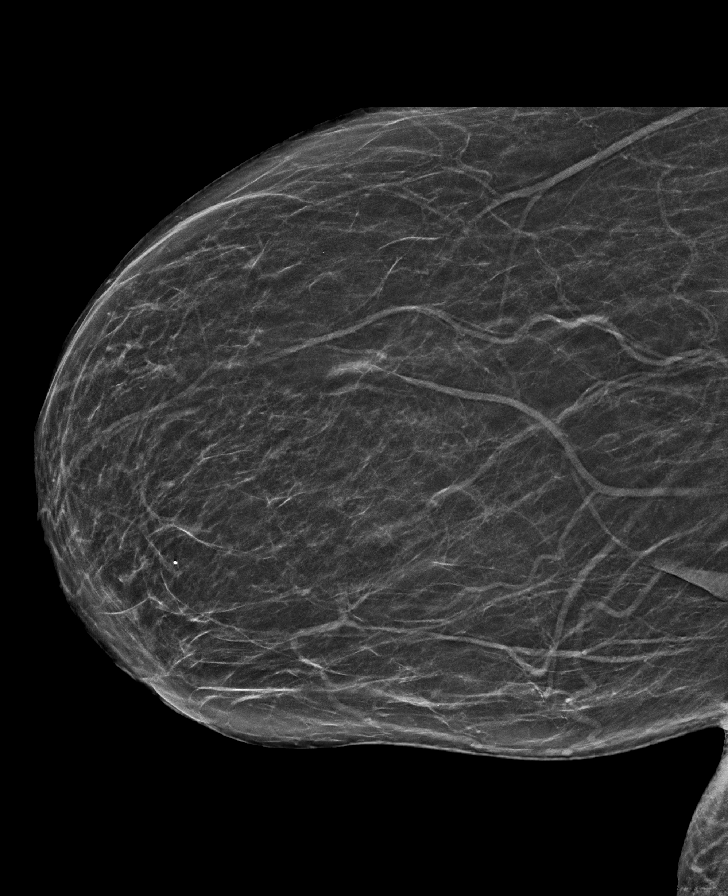

[R MLO synth-2D (2 of 2)]
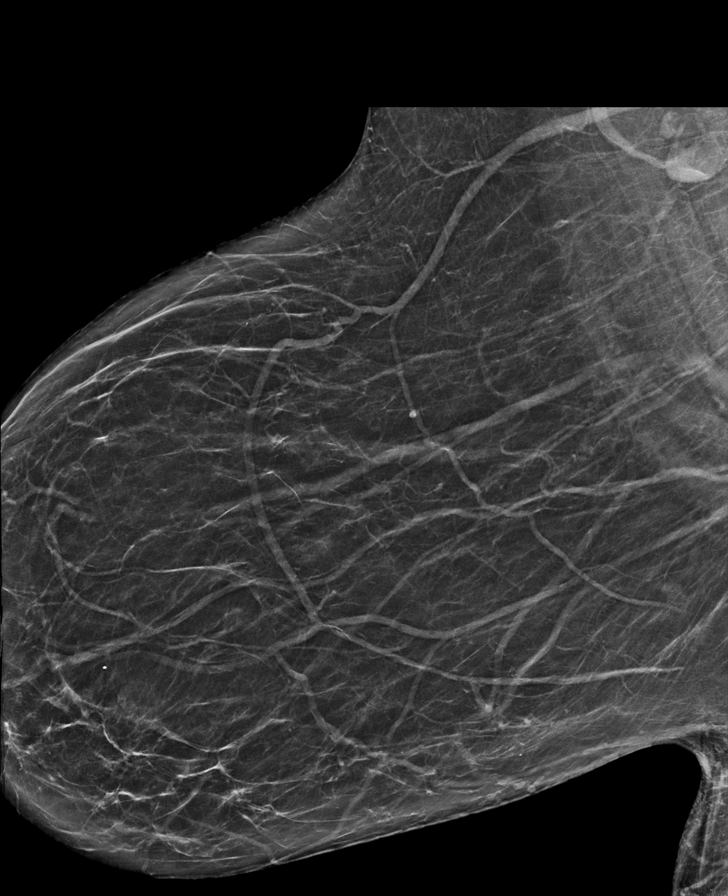

[R CC synth-2D (1 of 2)]
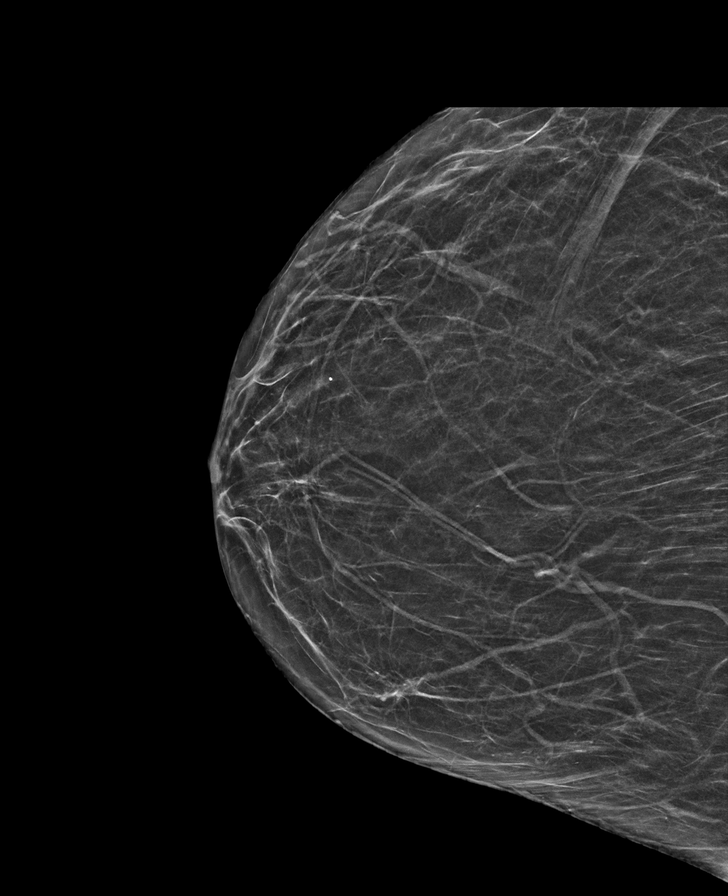

[L CC synth-2D]
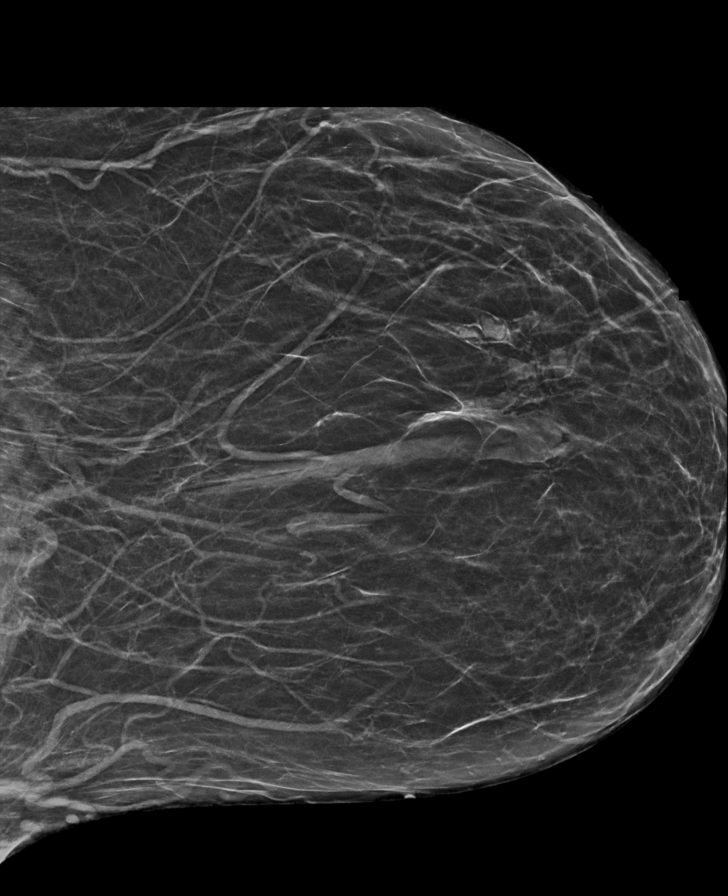

[L MLO synth-2D (1 of 2)]
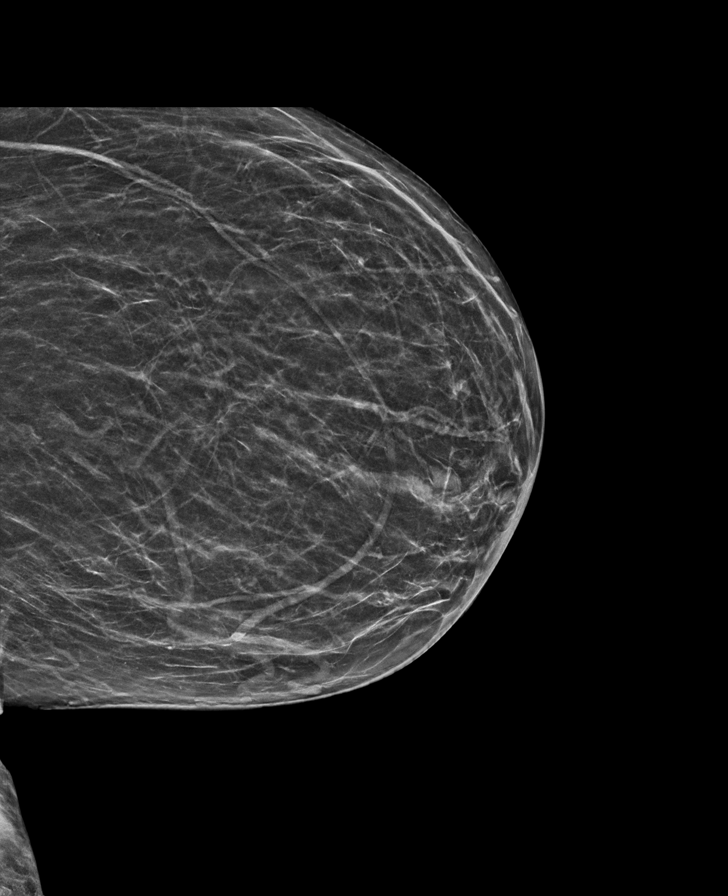

[L MLO synth-2D (2 of 2)]
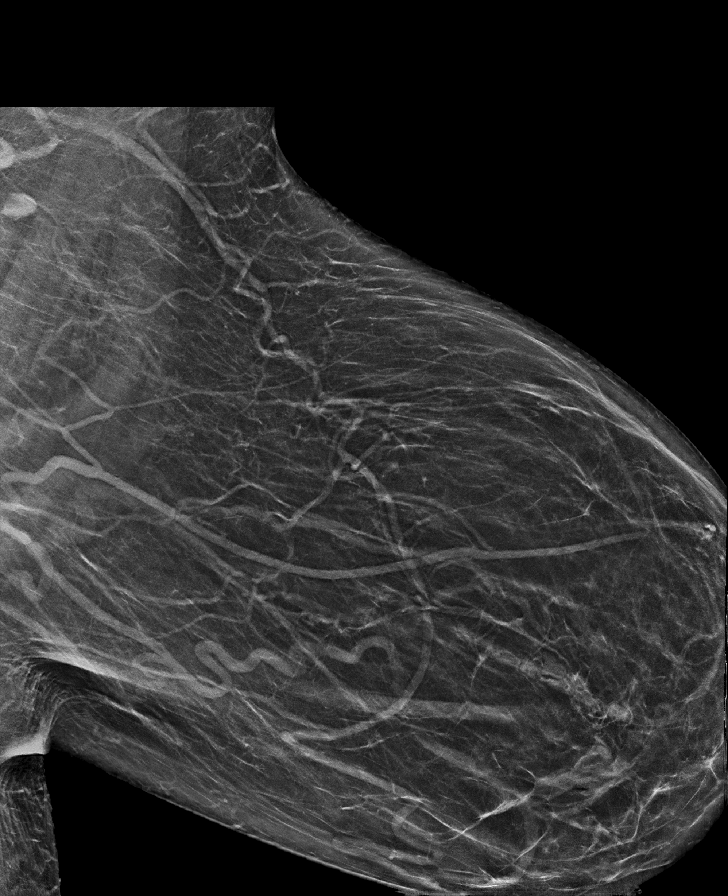

[R CC synth-2D (2 of 2)]
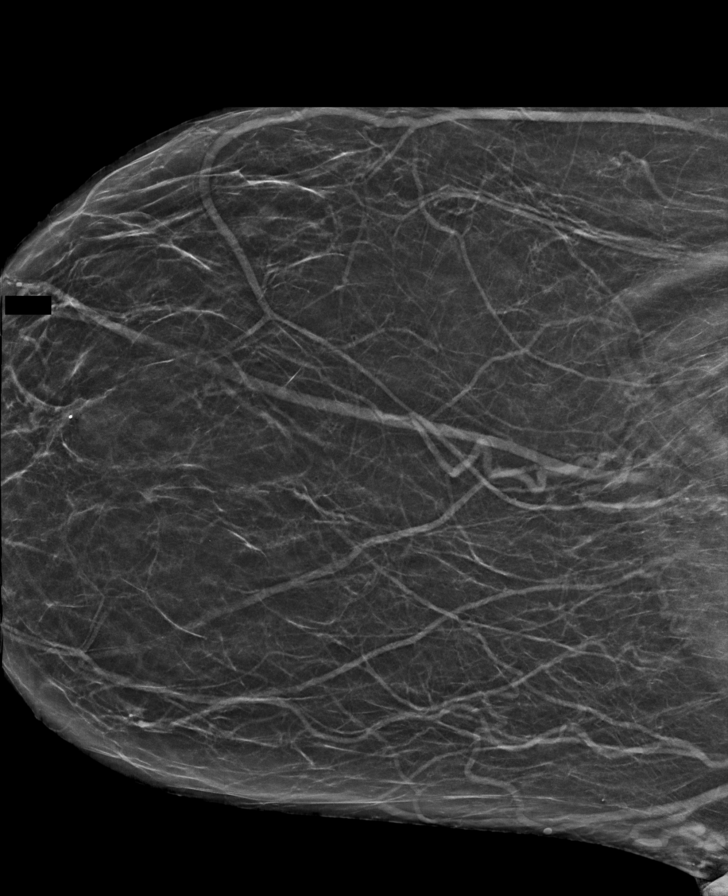

[L CC tomo · tomo slice 39/57.0]
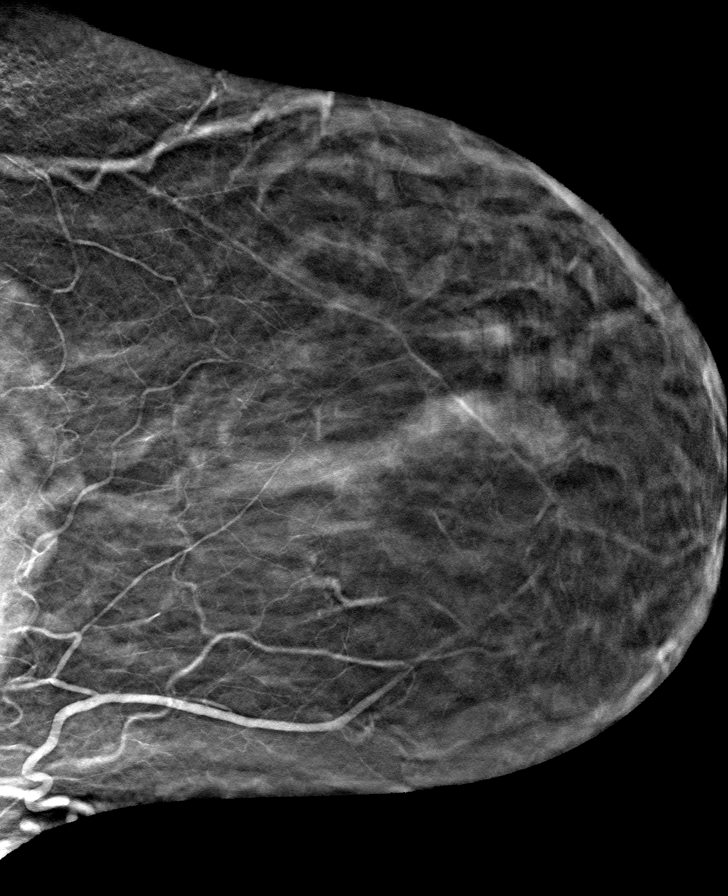

[8 of 40 positions shown; findings below may reference images not displayed]

ACR Breast Density Category b: There are scattered areas of
fibroglandular density.
FINDINGS: In the left breast, a possible mass warrants further evaluation. In
the right breast, no findings suspicious for malignancy.
IMPRESSION: Further evaluation is suggested for a possible mass in the left
breast.

RECOMMENDATION:
Diagnostic mammogram and possibly ultrasound of the left breast.
(Code:[id number]-X-44M)

The patient will be contacted regarding the findings, and additional
imaging will be scheduled.

BI-RADS CATEGORY  0: Incomplete. Need additional imaging evaluation
and/or prior mammograms for comparison.
